# Patient Record
Sex: Female | Born: 1985 | Race: White | Hispanic: No | Marital: Married | Smoking: Former smoker
Health system: Southern US, Community
[De-identification: ages and names within clinical notes are randomized; demographics above are authoritative.]

## PROBLEM LIST (undated history)

## (undated) ENCOUNTER — Inpatient Hospital Stay (HOSPITAL_COMMUNITY): Payer: Self-pay

## (undated) DIAGNOSIS — Z87442 Personal history of urinary calculi: Secondary | ICD-10-CM

## (undated) DIAGNOSIS — F32A Depression, unspecified: Secondary | ICD-10-CM

## (undated) DIAGNOSIS — F419 Anxiety disorder, unspecified: Secondary | ICD-10-CM

## (undated) DIAGNOSIS — R198 Other specified symptoms and signs involving the digestive system and abdomen: Secondary | ICD-10-CM

## (undated) DIAGNOSIS — F329 Major depressive disorder, single episode, unspecified: Secondary | ICD-10-CM

## (undated) DIAGNOSIS — M67919 Unspecified disorder of synovium and tendon, unspecified shoulder: Secondary | ICD-10-CM

## (undated) DIAGNOSIS — M19011 Primary osteoarthritis, right shoulder: Secondary | ICD-10-CM

## (undated) DIAGNOSIS — J069 Acute upper respiratory infection, unspecified: Secondary | ICD-10-CM

## (undated) DIAGNOSIS — M719 Bursopathy, unspecified: Secondary | ICD-10-CM

## (undated) DIAGNOSIS — M24111 Other articular cartilage disorders, right shoulder: Secondary | ICD-10-CM

## (undated) HISTORY — DX: Anxiety disorder, unspecified: F41.9

## (undated) HISTORY — DX: Major depressive disorder, single episode, unspecified: F32.9

## (undated) HISTORY — PX: WISDOM TOOTH EXTRACTION: SHX21

## (undated) HISTORY — DX: Depression, unspecified: F32.A

## (undated) HISTORY — PX: TONSILLECTOMY: SUR1361

---

## 2005-02-17 ENCOUNTER — Emergency Department (HOSPITAL_COMMUNITY): Admission: EM | Admit: 2005-02-17 | Discharge: 2005-02-18 | Payer: Self-pay | Admitting: Emergency Medicine

## 2005-03-18 ENCOUNTER — Other Ambulatory Visit: Admission: RE | Admit: 2005-03-18 | Discharge: 2005-03-18 | Payer: Self-pay | Admitting: Obstetrics and Gynecology

## 2006-08-01 ENCOUNTER — Emergency Department (HOSPITAL_COMMUNITY): Admission: EM | Admit: 2006-08-01 | Discharge: 2006-08-01 | Payer: Self-pay | Admitting: Emergency Medicine

## 2008-04-13 ENCOUNTER — Inpatient Hospital Stay (HOSPITAL_COMMUNITY): Admission: AD | Admit: 2008-04-13 | Discharge: 2008-04-13 | Payer: Self-pay | Admitting: Obstetrics and Gynecology

## 2009-07-29 ENCOUNTER — Inpatient Hospital Stay (HOSPITAL_COMMUNITY): Admission: AD | Admit: 2009-07-29 | Discharge: 2009-07-29 | Payer: Self-pay | Admitting: Obstetrics & Gynecology

## 2009-09-01 ENCOUNTER — Other Ambulatory Visit: Admission: RE | Admit: 2009-09-01 | Discharge: 2009-09-01 | Payer: Self-pay | Admitting: Obstetrics and Gynecology

## 2009-10-21 ENCOUNTER — Inpatient Hospital Stay (HOSPITAL_COMMUNITY): Admission: AD | Admit: 2009-10-21 | Discharge: 2009-10-21 | Payer: Self-pay | Admitting: Obstetrics and Gynecology

## 2010-03-24 ENCOUNTER — Inpatient Hospital Stay (HOSPITAL_COMMUNITY)
Admission: RE | Admit: 2010-03-24 | Discharge: 2010-03-27 | Payer: Self-pay | Source: Home / Self Care | Attending: Obstetrics and Gynecology | Admitting: Obstetrics and Gynecology

## 2010-05-09 ENCOUNTER — Encounter: Payer: Self-pay | Admitting: Emergency Medicine

## 2010-06-29 LAB — COMPREHENSIVE METABOLIC PANEL
ALT: 16 U/L (ref 0–35)
AST: 23 U/L (ref 0–37)
Albumin: 2.8 g/dL — ABNORMAL LOW (ref 3.5–5.2)
Alkaline Phosphatase: 135 U/L — ABNORMAL HIGH (ref 39–117)
BUN: 9 mg/dL (ref 6–23)
CO2: 19 mEq/L (ref 19–32)
Calcium: 9.4 mg/dL (ref 8.4–10.5)
Chloride: 106 mEq/L (ref 96–112)
Creatinine, Ser: 0.61 mg/dL (ref 0.4–1.2)
GFR calc Af Amer: >60 mL/min (ref >60)
GFR calc non Af Amer: >60 mL/min (ref >60)
Glucose, Bld: 108 mg/dL — ABNORMAL HIGH (ref 70–99)
Potassium: 3.9 mEq/L (ref 3.5–5.1)
Sodium: 135 mEq/L (ref 135–145)
Total Bilirubin: 0.2 mg/dL — ABNORMAL LOW (ref 0.3–1.2)
Total Protein: 5.9 g/dL — ABNORMAL LOW (ref 6.0–8.3)

## 2010-06-29 LAB — CBC
HCT: 31.8 % — ABNORMAL LOW (ref 36.0–46.0)
HCT: 36.1 % (ref 36.0–46.0)
HCT: 36.6 % (ref 36.0–46.0)
Hemoglobin: 10.7 g/dL — ABNORMAL LOW (ref 12.0–15.0)
Hemoglobin: 12.1 g/dL (ref 12.0–15.0)
Hemoglobin: 12.6 g/dL (ref 12.0–15.0)
MCH: 31.4 pg (ref 26.0–34.0)
MCH: 32.3 pg (ref 26.0–34.0)
MCH: 32.3 pg (ref 26.0–34.0)
MCHC: 33.5 g/dL (ref 30.0–36.0)
MCHC: 33.6 g/dL (ref 30.0–36.0)
MCHC: 34.3 g/dL (ref 30.0–36.0)
MCV: 94 fL (ref 78.0–100.0)
MCV: 94.2 fL (ref 78.0–100.0)
MCV: 95.9 fL (ref 78.0–100.0)
Platelets: 219 10*3/uL (ref 150–400)
Platelets: 242 10*3/uL (ref 150–400)
Platelets: 245 10*3/uL (ref 150–400)
RBC: 3.31 MIL/uL — ABNORMAL LOW (ref 3.87–5.11)
RBC: 3.84 MIL/uL — ABNORMAL LOW (ref 3.87–5.11)
RBC: 3.89 MIL/uL (ref 3.87–5.11)
RDW: 13.8 % (ref 11.5–15.5)
RDW: 14.4 % (ref 11.5–15.5)
RDW: 14.6 % (ref 11.5–15.5)
WBC: 11.2 10*3/uL — ABNORMAL HIGH (ref 4.0–10.5)
WBC: 13.2 10*3/uL — ABNORMAL HIGH (ref 4.0–10.5)
WBC: 19.7 10*3/uL — ABNORMAL HIGH (ref 4.0–10.5)

## 2010-06-29 LAB — URIC ACID: Uric Acid, Serum: 6 mg/dL (ref 2.4–7.0)

## 2010-06-29 LAB — LACTATE DEHYDROGENASE: LDH: 134 U/L (ref 94–250)

## 2010-06-29 LAB — RPR: RPR Ser Ql: NONREACTIVE

## 2010-07-07 LAB — GC/CHLAMYDIA PROBE AMP, GENITAL
Chlamydia, DNA Probe: NEGATIVE
GC Probe Amp, Genital: NEGATIVE

## 2010-07-07 LAB — CBC
HCT: 37.4 % (ref 36.0–46.0)
Hemoglobin: 12.6 g/dL (ref 12.0–15.0)
MCHC: 33.6 g/dL (ref 30.0–36.0)
MCV: 91.1 fL (ref 78.0–100.0)
Platelets: 315 10*3/uL (ref 150–400)
RBC: 4.11 MIL/uL (ref 3.87–5.11)
RDW: 12.6 % (ref 11.5–15.5)
WBC: 14.4 10*3/uL — ABNORMAL HIGH (ref 4.0–10.5)

## 2010-07-07 LAB — URINALYSIS, ROUTINE W REFLEX MICROSCOPIC
Bilirubin Urine: NEGATIVE
Glucose, UA: NEGATIVE mg/dL
Hgb urine dipstick: NEGATIVE
Ketones, ur: NEGATIVE mg/dL
Nitrite: NEGATIVE
Protein, ur: NEGATIVE mg/dL
Specific Gravity, Urine: 1.02 (ref 1.005–1.030)
Urobilinogen, UA: 0.2 mg/dL (ref 0.0–1.0)
pH: 6.5 (ref 5.0–8.0)

## 2010-07-07 LAB — DIFFERENTIAL
Basophils Absolute: 0 10*3/uL (ref 0.0–0.1)
Basophils Relative: 0 % (ref 0–1)
Eosinophils Absolute: 0.1 10*3/uL (ref 0.0–0.7)
Eosinophils Relative: 1 % (ref 0–5)
Lymphocytes Relative: 20 % (ref 12–46)
Lymphs Abs: 2.9 10*3/uL (ref 0.7–4.0)
Monocytes Absolute: 1 10*3/uL (ref 0.1–1.0)
Monocytes Relative: 7 % (ref 3–12)
Neutro Abs: 10.3 10*3/uL — ABNORMAL HIGH (ref 1.7–7.7)
Neutrophils Relative %: 72 % (ref 43–77)

## 2010-07-07 LAB — HCG, QUANTITATIVE, PREGNANCY: hCG, Beta Chain, Quant, S: 110183 m[IU]/mL — ABNORMAL HIGH (ref <5)

## 2010-07-07 LAB — POCT PREGNANCY, URINE: Preg Test, Ur: POSITIVE

## 2010-07-07 LAB — WET PREP, GENITAL
Clue Cells Wet Prep HPF POC: NONE SEEN
Trich, Wet Prep: NONE SEEN
Yeast Wet Prep HPF POC: NONE SEEN

## 2010-07-07 LAB — ABO/RH: ABO/RH(D): A POS

## 2010-08-17 ENCOUNTER — Other Ambulatory Visit (HOSPITAL_COMMUNITY)
Admission: RE | Admit: 2010-08-17 | Discharge: 2010-08-17 | Disposition: A | Discharge status: Home / Self Care | Payer: 59 | Source: Ambulatory Visit | Attending: Obstetrics and Gynecology | Admitting: Obstetrics and Gynecology

## 2010-08-17 ENCOUNTER — Other Ambulatory Visit: Payer: Self-pay | Admitting: Obstetrics and Gynecology

## 2010-08-17 DIAGNOSIS — Z01419 Encounter for gynecological examination (general) (routine) without abnormal findings: Secondary | ICD-10-CM | POA: Insufficient documentation

## 2011-01-21 LAB — URINALYSIS, ROUTINE W REFLEX MICROSCOPIC
Bilirubin Urine: NEGATIVE
Glucose, UA: NEGATIVE mg/dL
Ketones, ur: NEGATIVE mg/dL
Leukocytes, UA: NEGATIVE
Nitrite: NEGATIVE
Protein, ur: NEGATIVE mg/dL
Specific Gravity, Urine: <1.005 — ABNORMAL LOW (ref 1.005–1.030)
Urobilinogen, UA: 0.2 mg/dL (ref 0.0–1.0)
pH: 7 (ref 5.0–8.0)

## 2011-01-21 LAB — URINE MICROSCOPIC-ADD ON

## 2011-01-21 LAB — URINE CULTURE
Colony Count: NO GROWTH
Culture: NO GROWTH

## 2011-02-26 ENCOUNTER — Inpatient Hospital Stay (HOSPITAL_COMMUNITY)
Admission: AD | Admit: 2011-02-26 | Discharge: 2011-02-26 | Disposition: A | Discharge status: Home / Self Care | Payer: 59 | Source: Ambulatory Visit | Attending: Obstetrics and Gynecology | Admitting: Obstetrics and Gynecology

## 2011-02-26 ENCOUNTER — Encounter (HOSPITAL_COMMUNITY): Payer: Self-pay | Admitting: *Deleted

## 2011-02-26 ENCOUNTER — Inpatient Hospital Stay (HOSPITAL_COMMUNITY): Payer: 59

## 2011-02-26 DIAGNOSIS — O209 Hemorrhage in early pregnancy, unspecified: Secondary | ICD-10-CM

## 2011-02-26 DIAGNOSIS — O26859 Spotting complicating pregnancy, unspecified trimester: Secondary | ICD-10-CM | POA: Insufficient documentation

## 2011-02-26 DIAGNOSIS — R109 Unspecified abdominal pain: Secondary | ICD-10-CM | POA: Insufficient documentation

## 2011-02-26 LAB — WET PREP, GENITAL: Yeast Wet Prep HPF POC: NONE SEEN

## 2011-02-26 LAB — URINALYSIS, ROUTINE W REFLEX MICROSCOPIC
Bilirubin Urine: NEGATIVE
Glucose, UA: NEGATIVE mg/dL
Specific Gravity, Urine: 1.015 (ref 1.005–1.030)
Urobilinogen, UA: 0.2 mg/dL (ref 0.0–1.0)

## 2011-02-26 LAB — URINE MICROSCOPIC-ADD ON

## 2011-02-26 LAB — HCG, QUANTITATIVE, PREGNANCY: hCG, Beta Chain, Quant, S: 17325 m[IU]/mL — ABNORMAL HIGH (ref <5)

## 2011-02-26 LAB — POCT PREGNANCY, URINE: Preg Test, Ur: POSITIVE

## 2011-02-26 NOTE — ED Provider Notes (Signed)
Chief Complaint:  Vaginal Bleeding and Abdominal Cramping   Shannon [name] is  25 y.o. G3P1011 at [redacted]w[redacted]d presents complaining of Vaginal Bleeding and Abdominal Cramping Denies recent intercourse.  Had + HPT 3 days ago, periods are irregular so unsure of LMP.  Had brown spotting  Obstetrical/Gynecological History: Menstrual History: OB History    Grav Para Term Preterm Abortions TAB SAB Ect Mult Living   3 1 1  1  1   1        Patient's last menstrual period was 12/11/2010.     Past Medical History: Past Medical History  Diagnosis Date  . PIH (pregnancy induced hypertension)     Past Surgical History: Past Surgical History  Procedure Date  . Tonsillectomy   . Wisdom tooth extraction     Family History: No family history on file.  Social History: History  Substance Use Topics  . Smoking status: Never Smoker   . Smokeless tobacco: Not on file  . Alcohol Use: No    Allergies:  Allergies  Allergen Reactions  . Penicillins Hives    Meds:  Prescriptions prior to admission  Medication Sig Dispense Refill  . ibuprofen (ADVIL,MOTRIN) 200 MG tablet Take 200 mg by mouth every 6 (six) hours as needed. For pain         Review of Systems - Please refer to the aforementioned patients' reports.     Physical Exam  Blood pressure 130/85, pulse 115, temperature 98 F (36.7 C), resp. rate 16, height 5\' 10"  (1.778 m), weight 77.565 kg (171 lb), last menstrual period 12/11/2010. GENERAL: Well-developed, well-nourished female in no acute distress.  LUNGS: Clear to auscultation bilaterally.  HEART: Regular rate and rhythm. ABDOMEN: Soft, nontender, nondistended, gravid.  EXTREMITIES: Nontender, no edema, 2+ distal pulses. Cervical Exam: Dilatation 0cm   Effacement 0%    SSE:  Normal appearing discharge, cx non friable.  Wet prep, gc/chl collected   Labs: Recent Results (from the past 24 hour(s))  URINALYSIS, ROUTINE W REFLEX MICROSCOPIC   Collection Time   02/26/11  3:30  PM      Component Value Range   Color, Urine YELLOW  YELLOW    Appearance CLEAR  CLEAR    Specific Gravity, Urine 1.015  1.005 - 1.030    pH 6.0  5.0 - 8.0    Glucose, UA NEGATIVE  NEGATIVE (mg/dL)   Hgb urine dipstick TRACE (*) NEGATIVE    Bilirubin Urine NEGATIVE  NEGATIVE    Ketones, ur NEGATIVE  NEGATIVE (mg/dL)   Protein, ur NEGATIVE  NEGATIVE (mg/dL)   Urobilinogen, UA 0.2  0.0 - 1.0 (mg/dL)   Nitrite NEGATIVE  NEGATIVE    Leukocytes, UA NEGATIVE  NEGATIVE   URINE MICROSCOPIC-ADD ON   Collection Time   02/26/11  3:30 PM      Component Value Range   Squamous Epithelial / LPF FEW (*) RARE    WBC, UA 0-2  <3 (WBC/hpf)   RBC / HPF 0-2  <3 (RBC/hpf)   Bacteria, UA FEW (*) RARE   POCT PREGNANCY, URINE   Collection Time   02/26/11  3:37 PM      Component Value Range   Preg Test, Ur POSITIVE    Blood type A+ Imaging Studies:  US Ob Comp Less 14 Wks  02/26/2011  *RADIOLOGY REPORT*  Clinical Data: Abdominal pain, spotting  OBSTETRIC <14 WK ULTRASOUND  Technique:  Transabdominal ultrasound was performed for evaluation of the gestation as well as the maternal uterus and adnexal  regions.  Comparison:  None.  Intrauterine gestational sac: Present Yolk sac: Present Embryo: Present Cardiac Activity: Present Heart Rate: 110 bpm  CRL:  2 mm  fivew   sixd  Maternal uterus/Adnexae: No subchorionic hemorrhage.  Ovaries are normal.  No free fluid.  IMPRESSION: Single uterine gestational sac with embryo and cardiac activity.  Estimated gestational age by crown-rump length equals 5 weeks 6 days.  Original Report Authenticated By: Genevive Bi, M.D.     Assessment: Shannon [name] is  25 y.o. G3P1011 at [redacted]w[redacted]d presents with early IUP, spotting Dr. Chilton Si given report  Plan: F/U with MD prn any more problems  Shannon [name] Shannon [name] 11/10/20124:13 PM

## 2011-02-26 NOTE — Progress Notes (Signed)
Positive home pregnancy test 3 days ago, spotting and cramping today, unsure of last period ?August or September.

## 2011-03-14 LAB — OB RESULTS CONSOLE RUBELLA ANTIBODY, IGM: Rubella: IMMUNE

## 2011-03-14 LAB — OB RESULTS CONSOLE RPR: RPR: NONREACTIVE

## 2011-04-19 NOTE — L&D Delivery Note (Signed)
Delivery Note At 10:01 PM a viable female was delivered via Vaginal, Spontaneous Delivery (Presentation: Middle Occiput Anterior).  APGAR: 8, 9; weight .   Placenta status: Intact, Spontaneous.  Cord: 3 vessels with the following complications: None.  Cord pH: NA  Anesthesia: Epidural  Episiotomy: None Lacerations: 1st degree;Periurethral Suture Repair: 3.0 vicryl Est. Blood Loss (mL): 300  Mom to postpartum.  Baby to nursery-stable.  Shannon Lubas J. 10/18/2011, 10:30 PM

## 2011-07-10 ENCOUNTER — Inpatient Hospital Stay (HOSPITAL_COMMUNITY)
Admission: AD | Admit: 2011-07-10 | Discharge: 2011-07-10 | Disposition: A | Discharge status: Home / Self Care | Payer: 59 | Source: Ambulatory Visit | Attending: Obstetrics and Gynecology | Admitting: Obstetrics and Gynecology

## 2011-07-10 ENCOUNTER — Encounter (HOSPITAL_COMMUNITY): Payer: Self-pay

## 2011-07-10 DIAGNOSIS — R197 Diarrhea, unspecified: Secondary | ICD-10-CM

## 2011-07-10 DIAGNOSIS — R42 Dizziness and giddiness: Secondary | ICD-10-CM | POA: Insufficient documentation

## 2011-07-10 DIAGNOSIS — Z331 Pregnant state, incidental: Secondary | ICD-10-CM

## 2011-07-10 DIAGNOSIS — R51 Headache: Secondary | ICD-10-CM | POA: Insufficient documentation

## 2011-07-10 DIAGNOSIS — R748 Abnormal levels of other serum enzymes: Secondary | ICD-10-CM | POA: Insufficient documentation

## 2011-07-10 DIAGNOSIS — O99891 Other specified diseases and conditions complicating pregnancy: Secondary | ICD-10-CM | POA: Insufficient documentation

## 2011-07-10 LAB — URINALYSIS, ROUTINE W REFLEX MICROSCOPIC
Glucose, UA: NEGATIVE mg/dL
Leukocytes, UA: NEGATIVE
Nitrite: NEGATIVE
Specific Gravity, Urine: <1.005 — ABNORMAL LOW (ref 1.005–1.030)
pH: 7 (ref 5.0–8.0)

## 2011-07-10 LAB — DIFFERENTIAL
Basophils Absolute: 0 10*3/uL (ref 0.0–0.1)
Eosinophils Absolute: 0.2 10*3/uL (ref 0.0–0.7)
Eosinophils Relative: 2 % (ref 0–5)
Lymphocytes Relative: 23 % (ref 12–46)
Lymphs Abs: 2.9 10*3/uL (ref 0.7–4.0)
Monocytes Absolute: 1 10*3/uL (ref 0.1–1.0)

## 2011-07-10 LAB — COMPREHENSIVE METABOLIC PANEL
ALT: 69 U/L — ABNORMAL HIGH (ref 0–35)
Alkaline Phosphatase: 85 U/L (ref 39–117)
BUN: 7 mg/dL (ref 6–23)
Chloride: 102 mEq/L (ref 96–112)
GFR calc Af Amer: >90 mL/min (ref >90)
Glucose, Bld: 93 mg/dL (ref 70–99)
Potassium: 3.2 mEq/L — ABNORMAL LOW (ref 3.5–5.1)
Sodium: 136 mEq/L (ref 135–145)
Total Bilirubin: 0.1 mg/dL — ABNORMAL LOW (ref 0.3–1.2)
Total Protein: 6.1 g/dL (ref 6.0–8.3)

## 2011-07-10 LAB — CBC
HCT: 34.6 % — ABNORMAL LOW (ref 36.0–46.0)
Hemoglobin: 11.5 g/dL — ABNORMAL LOW (ref 12.0–15.0)
MCHC: 33.2 g/dL (ref 30.0–36.0)
RBC: 3.82 MIL/uL — ABNORMAL LOW (ref 3.87–5.11)
WBC: 12.8 10*3/uL — ABNORMAL HIGH (ref 4.0–10.5)

## 2011-07-10 LAB — GLUCOSE, CAPILLARY: Glucose-Capillary: 107 mg/dL — ABNORMAL HIGH (ref 70–99)

## 2011-07-10 NOTE — Discharge Instructions (Signed)
Bowel Movement Culture  A bowel movement culture checks your poop (bowel movement) for illnesses. The doctor will give you all the supplies you need. For each sample you collect, you may get:  A small container. You may be given different colored containers. Follow the instructions for each container you are given.  Gloves that can be thrown away.  A plastic bag.  Ask the doctor if you have questions. BEFORE COLLECTING THE SAMPLE Cover the toilet bowl with plastic wrap or a plastic bag. Tape the wrap or bag to the bowl of the toilet (not the seat). Do not stretch the plastic tight across the bowl. Leave room for the poop to fall. You can also use a plastic carton to catch your poop. Wash and dry any cartons. Keep these in the bathroom.  Try not to mix pee (urine) with your poop. Pee before pooping.  Do not mix toilet paper or water with your sample.  Women on their period should wait 3 days after their period has ended before collecting a sample.  COLLECTING THE SAMPLE Wash your hands.  Put on gloves.  Do not pour out the fluid that is in the tube. This fluid will preserve your sample.  Use the small shovel built into the top of the tube to put small scoops of your poop into the tube. Choose the parts of your poop which are bloody, slimy, or watery. Fill the tube up to the red line on the tube label. If your poop is hard, choose samples from each end and the middle.  Stir the poop in the tube with the small shovel. Close the top on the tube tightly. Shake the tube until the poop is well mixed.  On the label, write:  The date and time you collected the sample.  Your initials.  Put the tube in the plastic bag that was given to you.  If your doctor wants you to collect more than 1 sample, collect them at different times you poop.  Follow these instructions each time you collect a sample. AFTER COLLECTING THE SAMPLE Store your sample(s) using the directions on each test container you were given.  Some samples should be kept at room temperature. Others need to be refrigerated right away.  You may need to return the sample within 24 hours. Check the directions or call the clinic if you are not sure.  Flush the rest of your poop down the toilet (but not the plastic wrap). Throw away the gloves. Throw away the carton, if you used one.  Wash your hands.  Document Released: 05/07/2010 Document Revised: 03/24/2011 Document Reviewed: 05/07/2010 [facility] Patient Information 2012 [place], [place].B.R.A.T. Diet Your doctor has recommended the B.R.A.T. diet for you or your child until the condition improves. This is often used to help control diarrhea and vomiting symptoms. If you or your child can tolerate clear liquids, you may have:  Bananas.   Rice.   Applesauce.   Toast (and other simple starches such as crackers, potatoes, noodles).  Be sure to avoid dairy products, meats, and fatty foods until symptoms are better. Fruit juices such as apple, grape, and prune juice can make diarrhea worse. Avoid these. Continue this diet for 2 days or as instructed by your caregiver. Document Released: 04/04/2005 Document Revised: 03/24/2011 Document Reviewed: 09/21/2006 [facility] Patient Information 2012 [place], [place].Diarrhea Infections caused by germs (bacterial) or a virus commonly cause diarrhea. Your caregiver has determined that with time, rest and fluids, the diarrhea should improve. In  general, eat normally while drinking more water than usual. Although water may prevent dehydration, it does not contain salt and minerals (electrolytes). Broths, weak tea without caffeine and oral rehydration solutions (ORS) replace fluids and electrolytes. Small amounts of fluids should be taken frequently. Large amounts at one time may not be tolerated. Plain water may be harmful in infants and the elderly. Oral rehydrating solutions (ORS) are available at pharmacies and grocery stores. ORS replace water and important  electrolytes in proper proportions. Sports drinks are not as effective as ORS and may be harmful due to sugars worsening diarrhea.  ORS is especially recommended for use in children with diarrhea. As a general guideline for children, replace any new fluid losses from diarrhea and/or vomiting with ORS as follows:   If your child weighs 22 pounds or under (10 kg or less), give 60-120 mL ( -  cup or 2 - 4 ounces) of ORS for each episode of diarrheal stool or vomiting episode.   If your child weighs more than 22 pounds (more than 10 kgs), give 120-240 mL ( - 1 cup or 4 - 8 ounces) of ORS for each diarrheal stool or episode of vomiting.   While correcting for dehydration, children should eat normally. However, foods high in sugar should be avoided because this may worsen diarrhea. Large amounts of carbonated soft drinks, juice, gelatin desserts and other highly sugared drinks should be avoided.   After correction of dehydration, other liquids that are appealing to the child may be added. Children should drink small amounts of fluids frequently and fluids should be increased as tolerated. Children should drink enough fluids to keep urine clear or pale yellow.   Adults should eat normally while drinking more fluids than usual. Drink small amounts of fluids frequently and increase as tolerated. Drink enough fluids to keep urine clear or pale yellow. Broths, weak decaffeinated tea, lemon lime soft drinks (allowed to go flat) and ORS replace fluids and electrolytes.   Avoid:   Carbonated drinks.   Juice.   Extremely hot or cold fluids.   Caffeine drinks.   Fatty, greasy foods.   Alcohol.   Tobacco.   Too much intake of anything at one time.   Gelatin desserts.   Probiotics are active cultures of beneficial bacteria. They may lessen the amount and number of diarrheal stools in adults. Probiotics can be found in yogurt with active cultures and in supplements.   Wash hands well to avoid  spreading bacteria and virus.   Anti-diarrheal medications are not recommended for infants and children.   Only take over-the-counter or prescription medicines for pain, discomfort or fever as directed by your caregiver. Do not give aspirin to children because it may cause Reye's Syndrome.   For adults, ask your caregiver if you should continue all prescribed and over-the-counter medicines.   If your caregiver has given you a follow-up appointment, it is very important to keep that appointment. Not keeping the appointment could result in a chronic or permanent injury, and disability. If there is any problem keeping the appointment, you must call back to this facility for assistance.  SEEK IMMEDIATE MEDICAL CARE IF:   You or your child is unable to keep fluids down or other symptoms or problems become worse in spite of treatment.   Vomiting or diarrhea develops and becomes persistent.   There is vomiting of blood or bile (green material).   There is blood in the stool or the stools are black and tarry.  There is no urine output in 6-8 hours or there is only a small amount of very dark urine.   Abdominal pain develops, increases or localizes.   You have a fever.   Your baby is older than 3 months with a rectal temperature of 102 F (38.9 C) or higher.   Your baby is 47 months old or younger with a rectal temperature of 100.4 F (38 C) or higher.   You or your child develops excessive weakness, dizziness, fainting or extreme thirst.   You or your child develops a rash, stiff neck, severe headache or become irritable or sleepy and difficult to awaken.  MAKE SURE YOU:   Understand these instructions.   Will watch your condition.   Will get help right away if you are not doing well or get worse.  Document Released: 03/25/2002 Document Revised: 03/24/2011 Document Reviewed: 02/09/2009 [facility] Patient Information 2012 [place], [place].

## 2011-07-10 NOTE — MAU Note (Signed)
Lightheaded since last night, headache, denies fever  30 weeks

## 2011-07-10 NOTE — MAU Provider Note (Signed)
D/w provider.  Labs ordered for tomorrow at [facility] ob/gyn.

## 2011-07-10 NOTE — MAU Note (Signed)
Patient c/o diarrhea since Friday,, took Imodium

## 2011-07-10 NOTE — MAU Provider Note (Signed)
Chief Complaint:  Dizziness, Headache and Diarrhea    First Provider Initiated Contact with Patient 07/10/11 Shannon [name] is  26 y.o. G3P1011.  Patient's last menstrual period was 12/11/2010.. [redacted]w[redacted]d   She presents complaining of Dizziness, Headache and Diarrhea . Onset is described as ongoing and has been present for  3 days. Extremely foul-smelling diarrhea since Friday, states it has slowed today after taking Imodium this morning. Denies nausea, vomiting, fever, chills and abd pain. Reports dizziness and light headed today. Worse with movement but still present when lying down.   Obstetrical/Gynecological History: OB History    Grav Para Term Preterm Abortions TAB SAB Ect Mult Living   3 1 1  1  0 1 0 0 1      Past Medical History: Past Medical History  Diagnosis Date  . PIH (pregnancy induced hypertension)   . Pregnancy induced hypertension     Past Surgical History: Past Surgical History  Procedure Date  . Tonsillectomy   . Wisdom tooth extraction     Family History: Family History  Problem Relation Age of Onset  . Anesthesia problems Neg Hx     Social History: History  Substance Use Topics  . Smoking status: Never Smoker   . Smokeless tobacco: Not on file  . Alcohol Use: No    Allergies:  Allergies  Allergen Reactions  . Penicillins Hives    Prescriptions prior to admission  Medication Sig Dispense Refill  . acetaminophen (TYLENOL) 500 MG tablet Take 1,000 mg by mouth every 6 (six) hours as needed. Head ache      . loperamide (IMODIUM A-D) 2 MG tablet Take 4 mg by mouth 4 (four) times daily as needed. Upset stomach      . Prenatal Vit-Fe Fumarate-FA (PRENATAL MULTIVITAMIN) TABS Take 1 tablet by mouth at bedtime.        Review of Systems - Negative except what has been reviewed in the HPI  Physical Exam   Blood pressure 124/72, pulse 93, temperature 98 F (36.7 C), temperature source Oral, resp. rate 16, height 5\' 10"  (1.778 m), weight  177 lb 3.2 oz (80.377 kg), last menstrual period 12/11/2010, SpO2 100.00%.  General: General appearance - alert, well appearing, and in no distress, oriented to person, place, and time and overweight Mental status - alert, oriented to person, place, and time, normal mood, behavior, speech, dress, motor activity, and thought processes, affect appropriate to mood Abdomen - gravid, non tender Focused Gynecological Exam: examination not indicated,  FHT: Toco  Labs: Recent Results (from the past 24 hour(s))  URINALYSIS, ROUTINE W REFLEX MICROSCOPIC   Collection Time   07/10/11  6:02 PM      Component Value Range   Color, Urine YELLOW  YELLOW    APPearance CLEAR  CLEAR    Specific Gravity, Urine <1.005 (*) 1.005 - 1.030    pH 7.0  5.0 - 8.0    Glucose, UA NEGATIVE  NEGATIVE (mg/dL)   Hgb urine dipstick NEGATIVE  NEGATIVE    Bilirubin Urine NEGATIVE  NEGATIVE    Ketones, ur NEGATIVE  NEGATIVE (mg/dL)   Protein, ur NEGATIVE  NEGATIVE (mg/dL)   Urobilinogen, UA 0.2  0.0 - 1.0 (mg/dL)   Nitrite NEGATIVE  NEGATIVE    Leukocytes, UA NEGATIVE  NEGATIVE   GLUCOSE, CAPILLARY   Collection Time   07/10/11  6:28 PM      Component Value Range   Glucose-Capillary 107 (*) 70 - 99 (mg/dL)  CBC   Collection Time   07/10/11  6:35 PM      Component Value Range   WBC 12.8 (*) 4.0 - 10.5 (K/uL)   RBC 3.82 (*) 3.87 - 5.11 (MIL/uL)   Hemoglobin 11.5 (*) 12.0 - 15.0 (g/dL)   HCT 16.1 (*) 09.6 - 46.0 (%)   MCV 90.6  78.0 - 100.0 (fL)   MCH 30.1  26.0 - 34.0 (pg)   MCHC 33.2  30.0 - 36.0 (g/dL)   RDW 04.5  40.9 - 81.1 (%)   Platelets 335  150 - 400 (K/uL)  COMPREHENSIVE METABOLIC PANEL   Collection Time   07/10/11  6:35 PM      Component Value Range   Sodium 136  135 - 145 (mEq/L)   Potassium 3.2 (*) 3.5 - 5.1 (mEq/L)   Chloride 102  96 - 112 (mEq/L)   CO2 23  19 - 32 (mEq/L)   Glucose, Bld 93  70 - 99 (mg/dL)   BUN 7  6 - 23 (mg/dL)   Creatinine, Ser 9.14  0.50 - 1.10 (mg/dL)   Calcium 9.3  8.4  - 10.5 (mg/dL)   Total Protein 6.1  6.0 - 8.3 (g/dL)   Albumin 2.8 (*) 3.5 - 5.2 (g/dL)   AST 43 (*) 0 - 37 (U/L)   ALT 69 (*) 0 - 35 (U/L)   Alkaline Phosphatase 85  39 - 117 (U/L)   Total Bilirubin 0.1 (*) 0.3 - 1.2 (mg/dL)   GFR calc non Af Amer >90  >90 (mL/min)   GFR calc Af Amer >90  >90 (mL/min)  DIFFERENTIAL   Collection Time   07/10/11  6:35 PM      Component Value Range   Neutrophils Relative 68  43 - 77 (%)   Neutro Abs 8.7 (*) 1.7 - 7.7 (K/uL)   Lymphocytes Relative 23  12 - 46 (%)   Lymphs Abs 2.9  0.7 - 4.0 (K/uL)   Monocytes Relative 8  3 - 12 (%)   Monocytes Absolute 1.0  0.1 - 1.0 (K/uL)   Eosinophils Relative 2  0 - 5 (%)   Eosinophils Absolute 0.2  0.0 - 0.7 (K/uL)   Basophils Relative 0  0 - 1 (%)   Basophils Absolute 0.0  0.0 - 0.1 (K/uL)   MD Consult: Discussed with Dr. Dion Body.  7:58 PM Dr. Dion Body updated with lab results, will obtain acute hepatitis panel and send pt home with stool culture kit. Pt to FU in office tomorrow for repeat labs. Stop imodium   Assessment: Pregnant [redacted]w[redacted]d Diarrhea Elevated Liver Enzyme of unknown etiology  Plan: Discharge home Stool culture FU in office tomorrow to return culture and repeat labs Pt instructed to stop immodium  Saanya Zieske E. 07/10/2011,7:58 PM

## 2011-07-11 LAB — HEPATITIS PANEL, ACUTE: Hep B C IgM: NEGATIVE

## 2011-08-15 ENCOUNTER — Inpatient Hospital Stay (HOSPITAL_COMMUNITY)
Admission: AD | Admit: 2011-08-15 | Discharge: 2011-08-15 | Disposition: A | Discharge status: Home / Self Care | Payer: 59 | Source: Ambulatory Visit | Attending: Obstetrics and Gynecology | Admitting: Obstetrics and Gynecology

## 2011-08-15 ENCOUNTER — Encounter (HOSPITAL_COMMUNITY): Payer: Self-pay | Admitting: *Deleted

## 2011-08-15 DIAGNOSIS — O99891 Other specified diseases and conditions complicating pregnancy: Secondary | ICD-10-CM | POA: Insufficient documentation

## 2011-08-15 DIAGNOSIS — O26899 Other specified pregnancy related conditions, unspecified trimester: Secondary | ICD-10-CM

## 2011-08-15 DIAGNOSIS — R109 Unspecified abdominal pain: Secondary | ICD-10-CM | POA: Insufficient documentation

## 2011-08-15 LAB — URINALYSIS, ROUTINE W REFLEX MICROSCOPIC
Glucose, UA: NEGATIVE mg/dL
Leukocytes, UA: NEGATIVE
Nitrite: NEGATIVE
Specific Gravity, Urine: 1.015 (ref 1.005–1.030)
pH: 7.5 (ref 5.0–8.0)

## 2011-08-15 LAB — URINE MICROSCOPIC-ADD ON

## 2011-08-15 NOTE — MAU Note (Signed)
Pt states she started having abdominal cramping abour 1300

## 2011-08-15 NOTE — MAU Note (Signed)
Patient states she has been having regular cramping since about 1300. Denies any bleeding or leaking. Reports fetal movement, not as much as usual.

## 2011-08-15 NOTE — MAU Provider Note (Signed)
History     CSN: [id number]  Arrival date and time: 08/15/11 1436   First Provider Initiated Contact with Patient 08/15/11 1608      Chief Complaint  Patient presents with  . Labor Eval   HPI 26 y.o. G3P1011 at [redacted]w[redacted]d with cramping x 2 hours, now resolved, no bleeding or LOF, + nausea, + fetal movement. Sees Dr. Richardson Dopp for prenatal care - next appointment tomorrow.    Past Medical History  Diagnosis Date  . PIH (pregnancy induced hypertension)   . Pregnancy induced hypertension     Past Surgical History  Procedure Date  . Tonsillectomy   . Wisdom tooth extraction     Family History  Problem Relation Age of Onset  . Anesthesia problems Neg Hx     History  Substance Use Topics  . Smoking status: Former Games developer  . Smokeless tobacco: Former Neurosurgeon    Quit date: 08/14/2009  . Alcohol Use: No    Allergies:  Allergies  Allergen Reactions  . Penicillins Hives    Prescriptions prior to admission  Medication Sig Dispense Refill  . ferrous sulfate 325 (65 FE) MG tablet Take 325 mg by mouth daily with breakfast.      . Prenatal Vit-Fe Fumarate-FA (PRENATAL MULTIVITAMIN) TABS Take 1 tablet by mouth at bedtime.        Review of Systems  Constitutional: Negative.   Respiratory: Negative.   Cardiovascular: Negative.   Gastrointestinal: Positive for abdominal pain. Negative for nausea, vomiting, diarrhea and constipation.  Genitourinary: Negative for dysuria, urgency, frequency, hematuria and flank pain.       Negative for vaginal bleeding, contractions  Musculoskeletal: Negative.   Neurological: Negative.   Psychiatric/Behavioral: Negative.    Physical Exam   Blood pressure 116/77, pulse 102, temperature 97.4 F (36.3 C), temperature source Oral, resp. rate 20, height 5\' 9"  (1.753 m), weight 184 lb (83.462 kg), last menstrual period 12/11/2010, SpO2 100.00%.  Physical Exam  Nursing note and vitals reviewed. Constitutional: She is oriented to person, place, and time.  She appears well-developed and well-nourished. No distress.  Cardiovascular: Normal rate.   Respiratory: Effort normal.  GI: Soft. She exhibits no mass. There is no tenderness. There is no rebound and no guarding.  Genitourinary:       SVE: closed/long/high/posterior  Musculoskeletal: Normal range of motion.  Neurological: She is alert and oriented to person, place, and time.  Skin: Skin is warm and dry.  Psychiatric: She has a normal mood and affect.   EFM reactive, TOCO quiet MAU Course  Procedures  Results for orders placed during the hospital encounter of 08/15/11 (from the past 72 hour(s))  URINALYSIS, ROUTINE W REFLEX MICROSCOPIC     Status: Abnormal   Collection Time   08/15/11  2:40 PM      Component Value Range Comment   Color, Urine YELLOW  YELLOW     APPearance CLOUDY (*) CLEAR     Specific Gravity, Urine 1.015  1.005 - 1.030     pH 7.5  5.0 - 8.0     Glucose, UA NEGATIVE  NEGATIVE (mg/dL)    Hgb urine dipstick LARGE (*) NEGATIVE     Bilirubin Urine NEGATIVE  NEGATIVE     Ketones, ur NEGATIVE  NEGATIVE (mg/dL)    Protein, ur NEGATIVE  NEGATIVE (mg/dL)    Urobilinogen, UA 0.2  0.0 - 1.0 (mg/dL)    Nitrite NEGATIVE  NEGATIVE     Leukocytes, UA NEGATIVE  NEGATIVE    URINE  MICROSCOPIC-ADD ON     Status: Abnormal   Collection Time   08/15/11  2:40 PM      Component Value Range Comment   Squamous Epithelial / LPF RARE  RARE     WBC, UA 0-2  <3 (WBC/hpf)    RBC / HPF 21-50  <3 (RBC/hpf)    Bacteria, UA FEW (*) RARE    URINE CULTURE     Status: Normal   Collection Time   08/15/11  2:40 PM      Component Value Range Comment   Specimen Description OB CLEAN CATCH      Special Requests NONE      Culture  Setup Time [id number]      Colony Count NO GROWTH      Culture NO GROWTH      Report Status 08/16/2011 FINAL        Assessment and Plan  26 y.o. G3P1011 at [redacted]w[redacted]d No evidence of preterm labor F/U as scheduled, precautions rev'd  [name] 08/15/2011, 4:16  PM

## 2011-08-16 LAB — URINE CULTURE: Culture  Setup Time: 201304292203

## 2011-09-28 LAB — OB RESULTS CONSOLE GBS: GBS: NEGATIVE

## 2011-10-12 ENCOUNTER — Encounter (HOSPITAL_COMMUNITY): Payer: Self-pay | Admitting: *Deleted

## 2011-10-12 ENCOUNTER — Telehealth (HOSPITAL_COMMUNITY): Payer: Self-pay | Admitting: *Deleted

## 2011-10-12 NOTE — Telephone Encounter (Signed)
Preadmission screen  

## 2011-10-14 ENCOUNTER — Encounter (HOSPITAL_COMMUNITY): Payer: Self-pay | Admitting: *Deleted

## 2011-10-14 ENCOUNTER — Telehealth (HOSPITAL_COMMUNITY): Payer: Self-pay | Admitting: *Deleted

## 2011-10-14 NOTE — Telephone Encounter (Signed)
Preadmission screen  

## 2011-10-18 ENCOUNTER — Encounter (HOSPITAL_COMMUNITY): Payer: Self-pay

## 2011-10-18 ENCOUNTER — Inpatient Hospital Stay (HOSPITAL_COMMUNITY)
Admission: RE | Admit: 2011-10-18 | Discharge: 2011-10-20 | DRG: 767 | Disposition: A | Discharge status: Home / Self Care | Payer: 59 | Source: Ambulatory Visit | Attending: Obstetrics and Gynecology | Admitting: Obstetrics and Gynecology

## 2011-10-18 ENCOUNTER — Inpatient Hospital Stay (HOSPITAL_COMMUNITY): Payer: 59 | Admitting: Anesthesiology

## 2011-10-18 ENCOUNTER — Encounter (HOSPITAL_COMMUNITY): Payer: Self-pay | Admitting: Anesthesiology

## 2011-10-18 DIAGNOSIS — Z302 Encounter for sterilization: Secondary | ICD-10-CM

## 2011-10-18 LAB — CBC
Hemoglobin: 11.3 g/dL — ABNORMAL LOW (ref 12.0–15.0)
MCV: 88.8 fL (ref 78.0–100.0)
Platelets: 313 10*3/uL (ref 150–400)
RBC: 3.93 MIL/uL (ref 3.87–5.11)
WBC: 10.6 10*3/uL — ABNORMAL HIGH (ref 4.0–10.5)

## 2011-10-18 LAB — RPR: RPR Ser Ql: NONREACTIVE

## 2011-10-18 MED ORDER — EPHEDRINE 5 MG/ML INJ
10.0000 mg | INTRAVENOUS | Status: DC | PRN
  Administered 2011-10-18: 10 mg via INTRAVENOUS
  Filled 2011-10-18: qty 4

## 2011-10-18 MED ORDER — FENTANYL 2.5 MCG/ML BUPIVACAINE 1/10 % EPIDURAL INFUSION (WH - ANES)
INTRAMUSCULAR | Status: DC | PRN
  Administered 2011-10-18: 14 mL/h
  Administered 2011-10-18: 16 mL/h via EPIDURAL
  Administered 2011-10-18: 18:00:00

## 2011-10-18 MED ORDER — FLEET ENEMA 7-19 GM/118ML RE ENEM: 1.0000 | ENEMA | RECTAL | Status: DC | PRN

## 2011-10-18 MED ORDER — PHENYLEPHRINE 40 MCG/ML (10ML) SYRINGE FOR IV PUSH (FOR BLOOD PRESSURE SUPPORT)
80.0000 ug | PREFILLED_SYRINGE | INTRAVENOUS | Status: DC | PRN
  Administered 2011-10-18: 80 ug via INTRAVENOUS
  Filled 2011-10-18: qty 5

## 2011-10-18 MED ORDER — OXYCODONE-ACETAMINOPHEN 5-325 MG PO TABS: 1.0000 | ORAL_TABLET | ORAL | Status: DC | PRN

## 2011-10-18 MED ORDER — EPHEDRINE 5 MG/ML INJ: 10.0000 mg | INTRAVENOUS | Status: DC | PRN

## 2011-10-18 MED ORDER — LACTATED RINGERS IV SOLN: 500.0000 mL | INTRAVENOUS | Status: DC | PRN

## 2011-10-18 MED ORDER — LACTATED RINGERS IV SOLN: 500.0000 mL | Freq: Once | INTRAVENOUS | Status: DC

## 2011-10-18 MED ORDER — OXYTOCIN BOLUS FROM INFUSION
250.0000 mL | Freq: Once | INTRAVENOUS | Status: DC
  Filled 2011-10-18: qty 500

## 2011-10-18 MED ORDER — ACETAMINOPHEN 325 MG PO TABS: 650.0000 mg | ORAL_TABLET | ORAL | Status: DC | PRN

## 2011-10-18 MED ORDER — FENTANYL 2.5 MCG/ML BUPIVACAINE 1/10 % EPIDURAL INFUSION (WH - ANES)
14.0000 mL/h | INTRAMUSCULAR | Status: DC
  Filled 2011-10-18 (×3): qty 60

## 2011-10-18 MED ORDER — BUTORPHANOL TARTRATE 2 MG/ML IJ SOLN: 1.0000 mg | INTRAMUSCULAR | Status: DC | PRN

## 2011-10-18 MED ORDER — CITRIC ACID-SODIUM CITRATE 334-500 MG/5ML PO SOLN
30.0000 mL | ORAL | Status: DC | PRN
  Administered 2011-10-18 (×2): 30 mL via ORAL
  Filled 2011-10-18 (×2): qty 15

## 2011-10-18 MED ORDER — TERBUTALINE SULFATE 1 MG/ML IJ SOLN: 0.2500 mg | Freq: Once | INTRAMUSCULAR | Status: DC | PRN

## 2011-10-18 MED ORDER — IBUPROFEN 600 MG PO TABS: 600.0000 mg | ORAL_TABLET | Freq: Four times a day (QID) | ORAL | Status: DC | PRN

## 2011-10-18 MED ORDER — DIPHENHYDRAMINE HCL 50 MG/ML IJ SOLN
12.5000 mg | INTRAMUSCULAR | Status: DC | PRN
Start: 2011-10-18 — End: 2011-10-18

## 2011-10-18 MED ORDER — ONDANSETRON HCL 4 MG/2ML IJ SOLN: 4.0000 mg | Freq: Four times a day (QID) | INTRAMUSCULAR | Status: DC | PRN

## 2011-10-18 MED ORDER — OXYTOCIN 40 UNITS IN LACTATED RINGERS INFUSION - SIMPLE MED
1.0000 m[IU]/min | INTRAVENOUS | Status: DC
  Administered 2011-10-18: 2 m[IU]/min via INTRAVENOUS
  Filled 2011-10-18: qty 1000

## 2011-10-18 MED ORDER — OXYTOCIN 40 UNITS IN LACTATED RINGERS INFUSION - SIMPLE MED
62.5000 mL/h | Freq: Once | INTRAVENOUS | Status: AC
  Administered 2011-10-18: 62.5 mL/h via INTRAVENOUS

## 2011-10-18 MED ORDER — LACTATED RINGERS IV SOLN
INTRAVENOUS | Status: DC
  Administered 2011-10-18 (×3): via INTRAVENOUS

## 2011-10-18 MED ORDER — PHENYLEPHRINE 40 MCG/ML (10ML) SYRINGE FOR IV PUSH (FOR BLOOD PRESSURE SUPPORT): 80.0000 ug | PREFILLED_SYRINGE | INTRAVENOUS | Status: DC | PRN

## 2011-10-18 MED ORDER — LIDOCAINE HCL (PF) 1 % IJ SOLN
INTRAMUSCULAR | Status: DC | PRN
  Administered 2011-10-18: 4 mL
  Administered 2011-10-18: 5 mL

## 2011-10-18 MED ORDER — LIDOCAINE HCL (PF) 1 % IJ SOLN
30.0000 mL | INTRAMUSCULAR | Status: DC | PRN
  Filled 2011-10-18: qty 30

## 2011-10-18 NOTE — Progress Notes (Signed)
In to assess pt. Contractions are mild per patient  Cervix 2/50/ -1 Arom clear fluid , IUPC placed  FHR baseline 130's good btbv +accels no decels  Toco ctx q3-5 minutes  A/P 39 wks and 3 days  For social induction Continue pitocin  Anticipate svd

## 2011-10-18 NOTE — Progress Notes (Signed)
In to assess patient.  cx 6/100/0  FHR baseline 130's good btbv + accels no decels  Toco ctx q2-3 minutes  A./P 39wks and 3 days  Continue pitocin Anticipate svd

## 2011-10-18 NOTE — Anesthesia Preprocedure Evaluation (Signed)
Anesthesia Evaluation  Patient identified by MRN, date of birth, ID band Patient awake    Reviewed: Allergy & Precautions, H&P , Patient's Chart, lab work & pertinent test results  Airway Mallampati: III TM Distance: >3 FB Neck ROM: full    Dental No notable dental hx. (+) Teeth Intact   Pulmonary neg pulmonary ROS,  breath sounds clear to auscultation  Pulmonary exam normal       Cardiovascular Rhythm:regular Rate:Normal  PIH   Neuro/Psych Depression negative neurological ROS     GI/Hepatic Neg liver ROS, GERD-  Medicated and Controlled,  Endo/Other  negative endocrine ROS  Renal/GU negative Renal ROS  negative genitourinary   Musculoskeletal   Abdominal   Peds  Hematology negative hematology ROS (+)   Anesthesia Other Findings   Reproductive/Obstetrics (+) Pregnancy                           Anesthesia Physical Anesthesia Plan  ASA: II  Anesthesia Plan: Epidural   Post-op Pain Management:    Induction:   Airway Management Planned:   Additional Equipment:   Intra-op Plan:   Post-operative Plan:   Informed Consent: I have reviewed the patients History and Physical, chart, labs and discussed the procedure including the risks, benefits and alternatives for the proposed anesthesia with the patient or authorized representative who has indicated his/her understanding and acceptance.     Plan Discussed with: Anesthesiologist and Surgeon  Anesthesia Plan Comments:         Anesthesia Quick Evaluation

## 2011-10-18 NOTE — Anesthesia Procedure Notes (Signed)
Epidural Patient location during procedure: OB Start time: 10/18/2011 2:16 PM  Staffing Anesthesiologist: Ligaya Cormier A. Performed by: anesthesiologist   Preanesthetic Checklist Completed: patient identified, site marked, surgical consent, pre-op evaluation, timeout performed, IV checked, risks and benefits discussed and monitors and equipment checked  Epidural Patient position: sitting Prep: site prepped and draped and DuraPrep Patient monitoring: continuous pulse ox and blood pressure Approach: midline Injection technique: LOR air  Needle:  Needle type: Tuohy  Needle gauge: 17 G Needle length: 9 cm Needle insertion depth: 5 cm cm Catheter type: closed end flexible Catheter size: 19 Gauge Catheter at skin depth: 10 cm Test dose: negative and Other  Assessment Events: blood not aspirated, injection not painful, no injection resistance, negative IV test and no paresthesia  Additional Notes Patient identified. Risks and benefits discussed including failed block, incomplete  Pain control, post dural puncture headache, nerve damage, paralysis, blood pressure Changes, nausea, vomiting, reactions to medications-both toxic and allergic and post Partum back pain. All questions were answered. Patient expressed understanding and wished to proceed. Sterile technique was used throughout procedure. Epidural site was Dressed with sterile barrier dressing. No paresthesias, signs of intravascular injection Or signs of intrathecal spread were encountered.  Patient was more comfortable after the epidural was dosed. Please see RN's note for documentation of vital signs and FHR which are stable.

## 2011-10-18 NOTE — H&P (Signed)
Shannon [name] is a 26 y.o. female presenting for elective social induction. At 39 wks and 3 days. Pt desires postpartum tubal ligation. + FM no lof no vaginal bleeding. No contractions.   OB History    Grav Para Term Preterm Abortions TAB SAB Ect Mult Living   3 1 1  1  0 1 0 0 1     Past Medical History  Diagnosis Date  . PIH (pregnancy induced hypertension)   . Pregnancy induced hypertension   . Depression     ppd   Past Surgical History  Procedure Date  . Tonsillectomy   . Wisdom tooth extraction    Family History: family history includes Bipolar disorder in her mother; Heart disease in her maternal grandfather; and Hypertension in her maternal grandfather.  There is no history of Anesthesia problems. Social History:  reports that she quit smoking about 2 years ago. She has never used smokeless tobacco. She reports that she does not drink alcohol or use illicit drugs.  ROS negative except as stated in in hpi   Dilation: 1.5 Effacement (%): 50 Station: -1 Exam by:: Korah Hufstedler Blood pressure 121/75, pulse 68, temperature 98.3 F (36.8 C), temperature source Oral, height 5\' 9"  (1.753 m), weight 87.091 kg (192 lb), last menstrual period 12/11/2010. CV rrr  Lungs Clear  Abd gravid  nontender  Ext no edema   FHR baseline 140's reactive no decels   Prenatal labs: ABO, Rh:   A positive   Antibody:  Negative  Rubella:   IMMUNE  RPR: Nonreactive (11/26 0000)  HBsAg: NEGATIVE (03/24 1835)  HIV: Non-reactive (11/26 0000)  GBS: Negative (06/12 0000)   Assessment/Plan: 39 wks and 3 days for social induction. Pt is informed of increased r/o cesarean section with induction and accepts this risk. She desire pp tubal ligation. R/ b/ a/ of btl discussed with patient including but not limited to infection/ bleeding / damage to bowel bladder and surrounding organs with need for further surgery. R/(O failure 1 % with 50 % r/o ectopic if failure occurs. Pt voided understanding.    Tranell Wojtkiewicz  J. 10/18/2011, 11:13 AM

## 2011-10-19 ENCOUNTER — Encounter (HOSPITAL_COMMUNITY)
Admission: RE | Disposition: A | Discharge status: Home / Self Care | Payer: Self-pay | Source: Ambulatory Visit | Attending: Obstetrics and Gynecology

## 2011-10-19 ENCOUNTER — Encounter (HOSPITAL_COMMUNITY): Payer: Self-pay | Admitting: Anesthesiology

## 2011-10-19 ENCOUNTER — Inpatient Hospital Stay (HOSPITAL_COMMUNITY): Payer: 59 | Admitting: Anesthesiology

## 2011-10-19 HISTORY — PX: TUBAL LIGATION: SHX77

## 2011-10-19 LAB — CBC
Platelets: 268 10*3/uL (ref 150–400)
RBC: 3.17 MIL/uL — ABNORMAL LOW (ref 3.87–5.11)
WBC: 19.4 10*3/uL — ABNORMAL HIGH (ref 4.0–10.5)

## 2011-10-19 LAB — SURGICAL PCR SCREEN: MRSA, PCR: NEGATIVE

## 2011-10-19 SURGERY — LIGATION, FALLOPIAN TUBE, POSTPARTUM
Anesthesia: Epidural | Site: Abdomen | Laterality: Bilateral | Wound class: Clean

## 2011-10-19 MED ORDER — TETANUS-DIPHTH-ACELL PERTUSSIS 5-2.5-18.5 LF-MCG/0.5 IM SUSP
0.5000 mL | Freq: Once | INTRAMUSCULAR | Status: AC
  Administered 2011-10-20: 0.5 mL via INTRAMUSCULAR
  Filled 2011-10-19: qty 0.5

## 2011-10-19 MED ORDER — OXYCODONE-ACETAMINOPHEN 5-325 MG PO TABS
1.0000 | ORAL_TABLET | ORAL | Status: DC | PRN
  Administered 2011-10-19: 2 via ORAL
  Administered 2011-10-19 – 2011-10-20 (×4): 1 via ORAL
  Filled 2011-10-19 (×4): qty 1
  Filled 2011-10-19: qty 2

## 2011-10-19 MED ORDER — SENNOSIDES-DOCUSATE SODIUM 8.6-50 MG PO TABS
2.0000 | ORAL_TABLET | Freq: Every day | ORAL | Status: DC
  Administered 2011-10-19: 2 via ORAL

## 2011-10-19 MED ORDER — SIMETHICONE 80 MG PO CHEW: 80.0000 mg | CHEWABLE_TABLET | ORAL | Status: DC | PRN

## 2011-10-19 MED ORDER — FENTANYL CITRATE 0.05 MG/ML IJ SOLN
INTRAMUSCULAR | Status: AC
  Filled 2011-10-19: qty 2

## 2011-10-19 MED ORDER — METHYLERGONOVINE MALEATE 0.2 MG/ML IJ SOLN: 0.2000 mg | INTRAMUSCULAR | Status: DC | PRN

## 2011-10-19 MED ORDER — DEXAMETHASONE SODIUM PHOSPHATE 10 MG/ML IJ SOLN
INTRAMUSCULAR | Status: AC
  Filled 2011-10-19: qty 1

## 2011-10-19 MED ORDER — LANOLIN HYDROUS EX OINT: TOPICAL_OINTMENT | CUTANEOUS | Status: DC | PRN

## 2011-10-19 MED ORDER — PSEUDOEPHEDRINE HCL 30 MG PO TABS
30.0000 mg | ORAL_TABLET | Freq: Two times a day (BID) | ORAL | Status: DC | PRN
  Administered 2011-10-19: 30 mg via ORAL
  Filled 2011-10-19: qty 1

## 2011-10-19 MED ORDER — ONDANSETRON HCL 4 MG/2ML IJ SOLN
INTRAMUSCULAR | Status: AC
  Filled 2011-10-19: qty 2

## 2011-10-19 MED ORDER — ONDANSETRON HCL 4 MG/2ML IJ SOLN: 4.0000 mg | INTRAMUSCULAR | Status: DC | PRN

## 2011-10-19 MED ORDER — ONDANSETRON HCL 4 MG PO TABS: 4.0000 mg | ORAL_TABLET | ORAL | Status: DC | PRN

## 2011-10-19 MED ORDER — DEXAMETHASONE SODIUM PHOSPHATE 4 MG/ML IJ SOLN
INTRAMUSCULAR | Status: DC | PRN
  Administered 2011-10-19: 10 mg via INTRAVENOUS

## 2011-10-19 MED ORDER — SODIUM BICARBONATE 8.4 % IV SOLN
INTRAVENOUS | Status: AC
  Filled 2011-10-19: qty 50

## 2011-10-19 MED ORDER — METOCLOPRAMIDE HCL 10 MG PO TABS
10.0000 mg | ORAL_TABLET | Freq: Once | ORAL | Status: AC
  Administered 2011-10-19: 10 mg via ORAL
  Filled 2011-10-19: qty 1

## 2011-10-19 MED ORDER — FAMOTIDINE 20 MG PO TABS
40.0000 mg | ORAL_TABLET | Freq: Once | ORAL | Status: AC
  Administered 2011-10-19: 40 mg via ORAL
  Filled 2011-10-19: qty 2

## 2011-10-19 MED ORDER — PRENATAL MULTIVITAMIN CH
1.0000 | ORAL_TABLET | Freq: Every day | ORAL | Status: DC
  Administered 2011-10-20: 1 via ORAL
  Filled 2011-10-19 (×2): qty 1

## 2011-10-19 MED ORDER — BUPIVACAINE HCL (PF) 0.25 % IJ SOLN
INTRAMUSCULAR | Status: AC
  Filled 2011-10-19: qty 30

## 2011-10-19 MED ORDER — ONDANSETRON HCL 4 MG/2ML IJ SOLN
INTRAMUSCULAR | Status: DC | PRN
  Administered 2011-10-19: 4 mg via INTRAVENOUS

## 2011-10-19 MED ORDER — LIDOCAINE-EPINEPHRINE (PF) 2 %-1:200000 IJ SOLN
INTRAMUSCULAR | Status: AC
  Filled 2011-10-19: qty 20

## 2011-10-19 MED ORDER — ZOLPIDEM TARTRATE 5 MG PO TABS: 5.0000 mg | ORAL_TABLET | Freq: Every evening | ORAL | Status: DC | PRN

## 2011-10-19 MED ORDER — DIBUCAINE 1 % RE OINT: 1.0000 "application " | TOPICAL_OINTMENT | RECTAL | Status: DC | PRN

## 2011-10-19 MED ORDER — METHYLERGONOVINE MALEATE 0.2 MG PO TABS: 0.2000 mg | ORAL_TABLET | ORAL | Status: DC | PRN

## 2011-10-19 MED ORDER — LACTATED RINGERS IV SOLN
INTRAVENOUS | Status: DC
  Administered 2011-10-19 (×2): via INTRAVENOUS

## 2011-10-19 MED ORDER — HYDROMORPHONE HCL PF 1 MG/ML IJ SOLN: 0.2500 mg | INTRAMUSCULAR | Status: DC | PRN

## 2011-10-19 MED ORDER — MIDAZOLAM HCL 5 MG/5ML IJ SOLN
INTRAMUSCULAR | Status: DC | PRN
  Administered 2011-10-19 (×2): 1 mg via INTRAVENOUS

## 2011-10-19 MED ORDER — PROPOFOL 10 MG/ML IV EMUL
INTRAVENOUS | Status: AC
  Filled 2011-10-19: qty 20

## 2011-10-19 MED ORDER — BUPIVACAINE HCL (PF) 0.25 % IJ SOLN
INTRAMUSCULAR | Status: DC | PRN
  Administered 2011-10-19: 20 mL

## 2011-10-19 MED ORDER — WITCH HAZEL-GLYCERIN EX PADS: 1.0000 "application " | MEDICATED_PAD | CUTANEOUS | Status: DC | PRN

## 2011-10-19 MED ORDER — SODIUM BICARBONATE 8.4 % IV SOLN
INTRAVENOUS | Status: DC | PRN
  Administered 2011-10-19: 3 mL via EPIDURAL

## 2011-10-19 MED ORDER — PROMETHAZINE HCL 25 MG/ML IJ SOLN: 6.2500 mg | INTRAMUSCULAR | Status: DC | PRN

## 2011-10-19 MED ORDER — PSEUDOEPHEDRINE HCL 30 MG/5ML PO SYRP: 30.0000 mg | ORAL_SOLUTION | Freq: Two times a day (BID) | ORAL | Status: DC | PRN

## 2011-10-19 MED ORDER — CLINDAMYCIN PHOSPHATE 900 MG/50ML IV SOLN
900.0000 mg | INTRAVENOUS | Status: AC
  Administered 2011-10-19: 900 mg via INTRAVENOUS
  Filled 2011-10-19: qty 50

## 2011-10-19 MED ORDER — DIPHENHYDRAMINE HCL 25 MG PO CAPS: 25.0000 mg | ORAL_CAPSULE | Freq: Four times a day (QID) | ORAL | Status: DC | PRN

## 2011-10-19 MED ORDER — KETOROLAC TROMETHAMINE 30 MG/ML IJ SOLN: 15.0000 mg | Freq: Once | INTRAMUSCULAR | Status: DC | PRN

## 2011-10-19 MED ORDER — BENZOCAINE-MENTHOL 20-0.5 % EX AERO
1.0000 "application " | INHALATION_SPRAY | CUTANEOUS | Status: DC | PRN
  Administered 2011-10-19: 1 via TOPICAL
  Filled 2011-10-19: qty 56

## 2011-10-19 MED ORDER — MIDAZOLAM HCL 2 MG/2ML IJ SOLN
INTRAMUSCULAR | Status: AC
  Filled 2011-10-19: qty 2

## 2011-10-19 MED ORDER — PROPOFOL 10 MG/ML IV BOLUS
INTRAVENOUS | Status: DC | PRN
  Administered 2011-10-19 (×7): 10 mg via INTRAVENOUS

## 2011-10-19 MED ORDER — GUAIFENESIN 100 MG/5ML PO SOLN
15.0000 mL | ORAL | Status: DC | PRN
  Administered 2011-10-19 – 2011-10-20 (×3): 300 mg via ORAL
  Filled 2011-10-19 (×3): qty 15

## 2011-10-19 MED ORDER — CIPROFLOXACIN IN D5W 400 MG/200ML IV SOLN
400.0000 mg | INTRAVENOUS | Status: AC
  Administered 2011-10-19: 400 mg via INTRAVENOUS
  Filled 2011-10-19: qty 200

## 2011-10-19 MED ORDER — FENTANYL CITRATE 0.05 MG/ML IJ SOLN
INTRAMUSCULAR | Status: DC | PRN
  Administered 2011-10-19 (×2): 50 ug via INTRAVENOUS

## 2011-10-19 MED ORDER — IBUPROFEN 600 MG PO TABS
600.0000 mg | ORAL_TABLET | Freq: Four times a day (QID) | ORAL | Status: DC
  Administered 2011-10-19 – 2011-10-20 (×5): 600 mg via ORAL
  Filled 2011-10-19 (×5): qty 1

## 2011-10-19 MED ORDER — MEPERIDINE HCL 25 MG/ML IJ SOLN: 6.2500 mg | INTRAMUSCULAR | Status: DC | PRN

## 2011-10-19 SURGICAL SUPPLY — 23 items
CLIP FILSHIE TUBAL LIGA STRL (Clip) ×2 IMPLANT
CLOTH BEACON ORANGE TIMEOUT ST (SAFETY) ×2 IMPLANT
CONTAINER PREFILL 10% NBF 15ML (MISCELLANEOUS) ×4 IMPLANT
DERMABOND ADVANCED (GAUZE/BANDAGES/DRESSINGS) ×1
DERMABOND ADVANCED .7 DNX12 (GAUZE/BANDAGES/DRESSINGS) ×1 IMPLANT
ELECT REM PT RETURN 9FT ADLT (ELECTROSURGICAL) ×2
ELECTRODE REM PT RTRN 9FT ADLT (ELECTROSURGICAL) ×1 IMPLANT
GLOVE BIOGEL M 6.5 STRL (GLOVE) ×4 IMPLANT
GLOVE BIOGEL PI IND STRL 6.5 (GLOVE) ×1 IMPLANT
GLOVE BIOGEL PI INDICATOR 6.5 (GLOVE) ×1
GOWN PREVENTION PLUS LG XLONG (DISPOSABLE) ×4 IMPLANT
NEEDLE HYPO 25X1 1.5 SAFETY (NEEDLE) ×2 IMPLANT
NS IRRIG 1000ML POUR BTL (IV SOLUTION) ×2 IMPLANT
PACK ABDOMINAL MINOR (CUSTOM PROCEDURE TRAY) ×2 IMPLANT
PENCIL BUTTON HOLSTER BLD 10FT (ELECTRODE) ×2 IMPLANT
SLEEVE SCD COMPRESS KNEE MED (MISCELLANEOUS) IMPLANT
SPONGE LAP 4X18 X RAY DECT (DISPOSABLE) IMPLANT
SUT VICRYL 0 UR6 27IN ABS (SUTURE) ×2 IMPLANT
SUT VICRYL 4-0 PS2 18IN ABS (SUTURE) ×2 IMPLANT
SYR CONTROL 10ML LL (SYRINGE) ×2 IMPLANT
TOWEL OR 17X24 6PK STRL BLUE (TOWEL DISPOSABLE) ×4 IMPLANT
TRAY FOLEY CATH 14FR (SET/KITS/TRAYS/PACK) ×2 IMPLANT
WATER STERILE IRR 1000ML POUR (IV SOLUTION) ×2 IMPLANT

## 2011-10-19 NOTE — Op Note (Signed)
10/18/2011 - 10/19/2011  2:23 PM  PATIENT:  Shannon [name]  26 y.o. female  PRE-OPERATIVE DIAGNOSIS:  Desires permanent sterilization  POST-OPERATIVE DIAGNOSIS:  Desires permanent sterilization  PROCEDURE:  Procedure(s) (LRB): POST PARTUM TUBAL LIGATION (Bilateral)  SURGEON:  Surgeon(s) and Role:    * Kashmere Staffa J. Richardson Dopp, MD - Primary  PHYSICIAN ASSISTANT:   ASSISTANTS: none   ANESTHESIA:   epidural  EBL:  Total I/O In: 700 [I.V.:700] Out: 210 [Urine:200; Blood:10]  BLOOD ADMINISTERED:none  DRAINS: none   LOCAL MEDICATIONS USED:  MARCAINE     SPECIMEN:  No Specimen  DISPOSITION OF SPECIMEN:  N/A  COUNTS:  YES  TOURNIQUET:  * No tourniquets in log *  DICTATION: .Note written in EPIC  PLAN OF CARE: Admit to inpatient   PATIENT DISPOSITION:  PACU - hemodynamically stable.   Delay start of Pharmacological VTE agent (>24hrs) due to surgical blood loss or risk of bleeding: not applicable  Procedure: pt as taken to the operating room where her epidural was dosed and found to be adequate. She was prepped and draped in the usual sterile fashion. An infraumbilical incision was made with the scapel and carried down to the underlying layer of fascia. The fascia was incised. The perinuem was identified tented up and entered with scissors.the right fallopian tube was identified and followed out to the fimbriated end a Filshie clip was placed  Along the ampullary  portion of the right fallopian  tube without difficulty.  The left fallopian tube was identified followed out to the fimbriated end. A Filshie clip was placed along the ampullary portion of the left fallopian tube. 5 cc of quarter percent Marcaine was placed along the ampullary portion of the tubes  Bilaterally.  The fascia was closed with 0 Vicryl.  The skin was closed with 4-0 Vicryl. The dermabond placed a over the skin incision.  Patient was awakened from anesthesia taken to the recovery room awake and in stable condition.

## 2011-10-19 NOTE — Anesthesia Postprocedure Evaluation (Signed)
Anesthesia Post Note  Patient: Shannon [name]  Procedure(s) Performed: Procedure(s) (LRB): POST PARTUM TUBAL LIGATION (Bilateral)  Anesthesia type: Epidural  Patient location: PACU  Post pain: Pain level controlled  Post assessment: Post-op Vital signs reviewed  Last Vitals:  Filed Vitals:   10/19/11 1016  BP: 111/76  Pulse: 84  Temp: 36.8 C  Resp: 16    Post vital signs: Reviewed  Level of consciousness: awake  Complications: No apparent anesthesia complications

## 2011-10-19 NOTE — Anesthesia Preprocedure Evaluation (Signed)
Anesthesia Evaluation  Patient identified by MRN, date of birth, ID band Patient awake    Reviewed: Allergy & Precautions, H&P , NPO status , Patient's Chart, lab work & pertinent test results  Airway Mallampati: III TM Distance: >3 FB Neck ROM: full    Dental No notable dental hx. (+) Teeth Intact   Pulmonary neg pulmonary ROS,  breath sounds clear to auscultation  Pulmonary exam normal       Cardiovascular hypertension, Rhythm:regular Rate:Normal  PIH   Neuro/Psych Depression negative neurological ROS     GI/Hepatic Neg liver ROS, GERD-  Medicated and Controlled,  Endo/Other  negative endocrine ROS  Renal/GU negative Renal ROS  negative genitourinary   Musculoskeletal   Abdominal   Peds  Hematology negative hematology ROS (+)   Anesthesia Other Findings   Reproductive/Obstetrics negative OB ROS                           Anesthesia Physical  Anesthesia Plan  ASA: II  Anesthesia Plan: Epidural   Post-op Pain Management:    Induction:   Airway Management Planned:   Additional Equipment:   Intra-op Plan:   Post-operative Plan:   Informed Consent: I have reviewed the patients History and Physical, chart, labs and discussed the procedure including the risks, benefits and alternatives for the proposed anesthesia with the patient or authorized representative who has indicated his/her understanding and acceptance.     Plan Discussed with: Anesthesiologist and Surgeon  Anesthesia Plan Comments:         Anesthesia Quick Evaluation

## 2011-10-19 NOTE — H&P (Signed)
Date of Initial H&P: 10/18/2011 History reviewed, patient examined, no change in status, stable for surgery.  Pt is s/p vaginal delivery on 7/2/203 and desires permanent sterilization. I reviewed with the patient r/b/a of postpartum tubal ligation including infection/ bleeding damage to bowel bladder and surrounding organs with the need for further surgery. Pt is informed that this is a permanent form of sterilization. She is informed of 1 % r/o failure with 50 % r/o ectopic, which can be life threatening,  if failure occurs. She voiced understanding and desires to proceed with postpartum tubal ligation.

## 2011-10-19 NOTE — Transfer of Care (Signed)
Immediate Anesthesia Transfer of Care Note  Patient: [facility]  Procedure(s) Performed: Procedure(s) (LRB): POST PARTUM TUBAL LIGATION (Bilateral)  Patient Location: PACU  Anesthesia Type: Epidural  Level of Consciousness: awake, alert  and oriented  Airway & Oxygen Therapy: Patient Spontanous Breathing and Patient connected to nasal cannula oxygen  Post-op Assessment: Report given to PACU RN and Post -op Vital signs reviewed and stable  Post vital signs: Reviewed and stable  Complications: No apparent anesthesia complications

## 2011-10-20 ENCOUNTER — Encounter (HOSPITAL_COMMUNITY): Payer: Self-pay | Admitting: Obstetrics and Gynecology

## 2011-10-20 MED ORDER — IBUPROFEN 600 MG PO TABS: 600.0000 mg | ORAL_TABLET | Freq: Four times a day (QID) | ORAL | Status: AC

## 2011-10-20 MED ORDER — OXYCODONE-ACETAMINOPHEN 5-325 MG PO TABS: ORAL_TABLET | ORAL | Status: DC

## 2011-10-20 NOTE — Discharge Summary (Signed)
Obstetric Discharge Summary Reason for Admission: induction of labor Prenatal Procedures: ultrasound Intrapartum Procedures: spontaneous vaginal delivery Postpartum Procedures: none Complications-Operative and Postpartum: periurethral laceration, repaired, Postpartum tubal ligation. Hemoglobin  Date Value Range Status  10/19/2011 9.5* 12.0 - 15.0 g/dL Final     HCT  Date Value Range Status  10/19/2011 28.5* 36.0 - 46.0 % Final    Physical Exam:  General: alert, cooperative and no distress Lochia: appropriate Uterine Fundus: firm Incision: healing well, no significant drainage DVT Evaluation: No evidence of DVT seen on physical exam.  Discharge Diagnoses: Term Pregnancy-delivered  Discharge Information: Date: 10/20/2011 Activity: pelvic rest Diet: routine Medications: PNV, Ibuprofen and Percocet Condition: stable Instructions: See discharge instructions Discharge to: home Follow-up Information    Follow up with Jessee Avers., MD. Schedule an appointment as soon as possible for a visit in 6 weeks. (Postpartum check)    Contact information:   [address] [place] [address] [phone]       Follow up with Jessee Avers., MD in 2 weeks. [facility] check up)    Contact information:   [address] [place] [address] [phone]          Newborn Data: Live born female  Birth Weight: 6 lb 14.1 oz (3120 g) APGAR: 8, 9  Home with mother.  Shannon [name] 10/20/2011, 11:16 AM

## 2011-10-20 NOTE — Anesthesia Postprocedure Evaluation (Signed)
  Anesthesia Post-op Note  Patient: [employer]  Procedure(s) Performed: * Lumbar Epidural for L&D *   Patient Location: Short Stay  Anesthesia Type: Epidural  Level of Consciousness: awake, alert  and oriented  Airway and Oxygen Therapy: Patient Spontanous Breathing  Post-op Pain: none  Post-op Assessment: Post-op Vital signs reviewed, Patient's Cardiovascular Status Stable, Respiratory Function Stable, Patent Airway, No signs of Nausea or vomiting, Pain level controlled, No headache, No backache, No residual numbness and No residual motor weakness  Post-op Vital Signs: Reviewed and stable  Complications: No apparent anesthesia complications
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
## 2011-10-20 NOTE — Addendum Note (Signed)
Addendum  created 10/20/11 1610 by Lincoln Brigham, CRNA   Modules edited:Notes Section

## 2011-10-20 NOTE — Anesthesia Postprocedure Evaluation (Signed)
  Anesthesia Post-op Note  Patient: [employer]  Procedure(s) Performed: Procedure(s) (LRB): POST PARTUM TUBAL LIGATION (Bilateral)  Patient Location: Mother/Baby  Anesthesia Type: Epidural  Level of Consciousness: awake, alert  and oriented  Airway and Oxygen Therapy: Patient Spontanous Breathing  Post-op Pain: mild  Post-op Assessment: Patient's Cardiovascular Status Stable, Respiratory Function Stable, Patent Airway, No signs of Nausea or vomiting and Pain level controlled  Post-op Vital Signs: stable  Complications: No apparent anesthesia complications

## 2011-10-20 NOTE — Progress Notes (Signed)
Post Partum Day 2 Subjective: Complaint of incisional pain and uterine cramping with nursing.  Relieved with medication.  Pt with a h/o depression.  She initially states she had pp depression when she had to leave baby to go to work around 6 weeks.  Objective: Blood pressure 106/68, pulse 77, temperature 98 F (36.7 C), temperature source Oral, resp. rate 16, height 5\' 9"  (1.753 m), weight 87.091 kg (192 lb), last menstrual period 12/11/2010, SpO2 97.00%, unknown if currently breastfeeding.  Physical Exam:  General: alert, cooperative and no distress Lochia: appropriate Uterine Fundus: firm Incision: healing well, no significant drainage DVT Evaluation: No evidence of DVT seen on physical exam.   Basename 10/19/11 0535 10/18/11 0805  HGB 9.5* 11.3*  HCT 28.5* 34.9*    Assessment/Plan: Discharge home PP depression precautions.   F/u with Dr. Richardson Dopp in 2 weeks for depression screening. Percocet for incisional pain.   LOS: 2 days   Rosalee Tolley 10/20/2011, 11:08 AM

## 2012-02-23 ENCOUNTER — Emergency Department (HOSPITAL_COMMUNITY)
Admission: EM | Admit: 2012-02-23 | Discharge: 2012-02-24 | Disposition: A | Discharge status: Home / Self Care | Payer: Self-pay | Attending: Emergency Medicine | Admitting: Emergency Medicine

## 2012-02-23 ENCOUNTER — Encounter (HOSPITAL_COMMUNITY): Payer: Self-pay | Admitting: *Deleted

## 2012-02-23 DIAGNOSIS — Z87891 Personal history of nicotine dependence: Secondary | ICD-10-CM | POA: Insufficient documentation

## 2012-02-23 DIAGNOSIS — K089 Disorder of teeth and supporting structures, unspecified: Secondary | ICD-10-CM | POA: Insufficient documentation

## 2012-02-23 DIAGNOSIS — Z8659 Personal history of other mental and behavioral disorders: Secondary | ICD-10-CM | POA: Insufficient documentation

## 2012-02-23 DIAGNOSIS — K0889 Other specified disorders of teeth and supporting structures: Secondary | ICD-10-CM

## 2012-02-23 DIAGNOSIS — Z9889 Other specified postprocedural states: Secondary | ICD-10-CM | POA: Insufficient documentation

## 2012-02-23 NOTE — ED Notes (Signed)
Pt states she woke up with upper left dental pain this morning, has taken ibuprofen with no relief.

## 2012-02-24 MED ORDER — HYDROCODONE-ACETAMINOPHEN 5-325 MG PO TABS: 1.0000 | ORAL_TABLET | ORAL | Status: DC | PRN

## 2012-02-24 MED ORDER — CLINDAMYCIN HCL 300 MG PO CAPS: 300.0000 mg | ORAL_CAPSULE | Freq: Three times a day (TID) | ORAL | Status: DC

## 2012-02-24 NOTE — ED Provider Notes (Signed)
History     CSN: [id number]  Arrival date & time 02/23/12  2353   First MD Initiated Contact with Patient 02/24/12 0044      Chief Complaint  Patient presents with  . Dental Pain    (Consider location/radiation/quality/duration/timing/severity/associated sxs/prior treatment) HPI History provided by pt.   Pt presents w/ c/o left upper toothache since yesterday morning.  Severe and radiating to ear and mandible.  Temporary relief w/ ibuprofen and wine.  No associated fever.  Denies trauma.   Past Medical History  Diagnosis Date  . PIH (pregnancy induced hypertension)   . Pregnancy induced hypertension   . Depression     ppd    Past Surgical History  Procedure Date  . Tonsillectomy   . Wisdom tooth extraction   . Tubal ligation 10/19/2011    Procedure: POST PARTUM TUBAL LIGATION;  Surgeon: Dorien Chihuahua. Richardson Dopp, MD;  Location: [facility];  Service: Gynecology;  Laterality: Bilateral;    Family History  Problem Relation Age of Onset  . Anesthesia problems Neg Hx   . Bipolar disorder Mother   . Heart disease Maternal Grandfather   . Hypertension Maternal Grandfather     History  Substance Use Topics  . Smoking status: Former Smoker    Quit date: 10/13/2009  . Smokeless tobacco: Never Used  . Alcohol Use: Yes    OB History    Grav Para Term Preterm Abortions TAB SAB Ect Mult Living   3 2 2  1  0 1 0 0 2      Review of Systems  All other systems reviewed and are negative.    Allergies  Penicillins  Home Medications   Current Outpatient Rx  Name  Route  Sig  Dispense  Refill  . BC FAST PAIN RELIEF PO   Oral   Take by mouth every 6 (six) hours as needed. For pain         . IBUPROFEN 600 MG PO TABS   Oral   Take 600 mg by mouth every 6 (six) hours as needed. For pain         . PRENATAL MULTIVITAMIN CH   Oral   Take 1 tablet by mouth at bedtime.         Marland Kitchen CLINDAMYCIN HCL 300 MG PO CAPS   Oral   Take 1 capsule (300 mg total) by mouth 3 (three) times  daily.   21 capsule   0   . HYDROCODONE-ACETAMINOPHEN 5-325 MG PO TABS   Oral   Take 1 tablet by mouth every 4 (four) hours as needed for pain.   20 tablet   0     BP 137/89  Pulse 94  Temp 98.1 F (36.7 C) (Oral)  Resp 16  SpO2 98%  Breastfeeding? Unknown  Physical Exam  Nursing note and vitals reviewed. Constitutional: She is oriented to person, place, and time. She appears well-developed and well-nourished.  HENT:  Head: Normocephalic and atraumatic. No trismus in the jaw.  Mouth/Throat: Uvula is midline and oropharynx is clear and moist.       Filling has fallen out of left upper 2nd molar.  No visible new caries.  Non-tender.  Adjacent gingiva appears normal.  No edema of buccal mucosa.    Eyes:       Normal appearance  Neck: Normal range of motion. Neck supple.       No submandibular edema  Lymphadenopathy:    She has no cervical adenopathy.  Neurological: She is  alert and oriented to person, place, and time.  Psychiatric: She has a normal mood and affect. Her behavior is normal.    ED Course  Procedures (including critical care time)  Labs Reviewed - No data to display No results found.   1. Toothache       MDM  Pt presents w/ left upper toothache.  Will treat for possible periapical abscess w/ clinda (pen allergic) and vicodin.  She will continue ibuprofen and f/u with her dentist asap.  Return precautions discussed.         Arie Sabina [place], [place] 02/24/12 [phone]

## 2012-02-24 NOTE — ED Provider Notes (Signed)
Medical screening examination/treatment/procedure(s) were performed by non-physician practitioner and as supervising physician I was immediately available for consultation/collaboration.  Karli Wickizer M Pa Tennant, MD 02/24/12 0533 

## 2012-02-24 NOTE — ED Notes (Signed)
Pt seen by EDPA prior to RN assessment see PA notes, orders received for d/c and initiated.  Pt alert, NAD, calm, interactive, skin W&D, resps e/u, speaking in clear complete sentences. C/o L upper back toothache, whole side of face is hurting. Onset Thursday. Have been taking ibuprofen all day. (denies: nvd, fever, bleeding, drainage, swelling, dizziness or other sx.

## 2012-07-18 ENCOUNTER — Encounter: Payer: Self-pay | Admitting: Family Medicine

## 2012-07-18 ENCOUNTER — Ambulatory Visit (INDEPENDENT_AMBULATORY_CARE_PROVIDER_SITE_OTHER): Payer: 59 | Admitting: Family Medicine

## 2012-07-18 VITALS — BP 110/70 | HR 68 | Temp 97.8°F | Ht 69.75 in | Wt 164.0 lb

## 2012-07-18 DIAGNOSIS — M542 Cervicalgia: Secondary | ICD-10-CM

## 2012-07-18 DIAGNOSIS — R6884 Jaw pain: Secondary | ICD-10-CM

## 2012-07-18 DIAGNOSIS — F411 Generalized anxiety disorder: Secondary | ICD-10-CM | POA: Insufficient documentation

## 2012-07-18 MED ORDER — ALPRAZOLAM 0.25 MG PO TABS: 0.2500 mg | ORAL_TABLET | Freq: Three times a day (TID) | ORAL | Status: DC | PRN

## 2012-07-18 MED ORDER — CITALOPRAM HYDROBROMIDE 20 MG PO TABS: 20.0000 mg | ORAL_TABLET | Freq: Every day | ORAL | Status: DC

## 2012-07-18 NOTE — Patient Instructions (Signed)
Use alprazolam sparingly and with caution due to sedation.  Start with just 1/2 tablet. Call your employee assistance program to set up counseling. Start the citalopram at just 1/2 tablet for first week, and increase to full if tolerating without side effects.  Return in 6 weeks, sooner if needed. Remember to let us know if you develop high energy, lack of sleep, or other manic/hypomanic symptoms

## 2012-07-18 NOTE — Progress Notes (Signed)
Chief Complaint  Patient presents with  . Anxiety    new patient with anxiety. Also wants to ask you some questions re:her immune system. She feels like every couple weeks she is picking up a virus.    She has suffered from anxiety and depression, but has tried to avoid medications.  Many years ago she was put on Prozac, Seroquel (? If bipolar)--she recalls feeling like a zombie.  With her postpartum depression she was rx'd Celexa, but only took it for a week then stopped.  Denies any side effects, just didn't want to take meds.  Currently is finding that she clenches her jaw a lot, causing jaw pain, ear ringing.  She is irritable, "flips on her kids".  Has a 72 month old, just got a dog, just got married 2-3 weeks ago (partner of 4 years), stressful.  She wakes frequently through the night--related to new dog, neck pain, and baby sleeping with her.  She has a hard time falling asleep, hard to shut down mind.  Able to get back to sleep when woken up (by dog, baby, etc).  Has panic attacks, only about once a month--can't breathe, needs to go to a quiet room and breathe deeply/slowly.  No associated chest pain or tachycardia.  Panic attacks have been worse in the past, but controllable and only once a month since having the baby.  For stress reduction she walks dog, listens to music, goes to room by herself to escape others.  Denies depression, but admits to sometimes feeling helpless.  Since getting married she has been binge-eating more (was able to lose some weight for her wedding).  Neck pain--in a lot of pain. She has a bulging disc.  Planning to start PT soon.  She was just started on Naproxen about 5 days ago, which seems to be helping with the neck pain, and slightly with jaw pain.  In past took xanax--too sedating.  Took valium prior to MRI, seemed less sedating.  Doesn't recall dose of xanax she tried. When asked about manic or hypomanic behavior (since previously possibly diagnosed as bipolar), she  describes herself as "happy and in a good mood"--denies true symptoms of mania or hypomania.  She is also concerned that she has gotten the stomach bug (vomiting and diarrhea) frequently.  No other frequent illnesses (URI or any bacterial infections).   Past Medical History  Diagnosis Date  . Pregnancy induced hypertension     with both pregnancies  . Postpartum depression     with first pregnancy  . Neck pain     bulging C-3 (Dr. Maurice Small at [facility])    Past Surgical History  Procedure Laterality Date  . Tonsillectomy    . Wisdom tooth extraction    . Tubal ligation  10/19/2011    Procedure: POST PARTUM TUBAL LIGATION;  Surgeon: Dorien Chihuahua. Richardson Dopp, MD;  Location: [facility];  Service: Gynecology;  Laterality: Bilateral;    History   Social History  . Marital Status: Married    Spouse Name: N/A    Number of Children: 2  . 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 Smoking status: Former Smoker    Quit date: 10/13/2009  . Smokeless tobacco: Never Used  . Alcohol Use: Yes     Comment: 3-4 drinks per weekend.  . Drug Use: No  . Sexually Active: Yes -- Female partner(s)    Birth  Control/ Protection: Surgical   Other Topics Concern  . Not on file   Social History Narrative   Married, 2 daughters, 1 dog, 1 cat.    Family History  Problem Relation Age of Onset  . Anesthesia problems Neg Hx   . Diabetes Neg Hx   . Bipolar disorder Mother   . Heart disease Maternal Grandfather   . Hypertension Maternal Grandfather   . Cancer Maternal Grandfather   . Depression Sister   . Anxiety disorder Sister   . Anxiety disorder Brother     Current outpatient prescriptions:Multiple Vitamins-Minerals (MULTIVITAMIN WITH MINERALS) tablet, Take 1 tablet by mouth daily., Disp: , Rfl: ;  naproxen (NAPROSYN) 500 MG tablet, Take 500 mg by mouth 2 (two) times daily with a meal., Disp: , Rfl: ;  ALPRAZolam (XANAX) 0.25 MG  tablet, Take 1 tablet (0.25 mg total) by mouth 3 (three) times daily as needed for sleep or anxiety. Take 1/2 to 1 tablet as needed, Disp: 15 tablet, Rfl: 0 citalopram (CELEXA) 20 MG tablet, Take 1 tablet (20 mg total) by mouth daily. Start at 1/2 tablet once daily for the first week., Disp: 30 tablet, Rfl: 1  Allergies  Allergen Reactions  . Penicillins Hives   ROS:  Denies fevers, URI or allergy symptoms, chest pain, palpitations, shortness of breath, cough, nausea, vomiting, recent diarrhea, urinary complaints, skin rashes.  +neck pain, anxiety, jaw pain, headaches (tension).  Denies suicidal ideation, bleeding, bruising, rash or other concerns.  PHYSICAL EXAM: BP 110/70  Pulse 68  Temp(Src) 97.8 F (36.6 C) (Oral)  Ht 5' 9.75" (1.772 m)  Wt 164 lb (74.39 kg)  BMI 23.69 kg/m2  LMP 07/14/2012  Breastfeeding? No Pleasant, well developed female in no distress HEENT:  PERRL, conjunctiva and sclera clear Neck: no lymphadenopathy, thyromegaly or mass.  No c-spine tenderness.  +tenderness in paraspinous muscles and trapezius Heart: regular rate and rhythm without murmur Lungs: clear bilaterally Abdomen: soft, nontender Extremities: no edema Skin: no rash Neuro: alert and oriented, normal cranial nerves, gait Psych: normal mood, full range of affect.  Normal speech, eye contact, hygiene and grooming  ASSESSMENT/PLAN:  Anxiety state, unspecified - Plan: citalopram (CELEXA) 20 MG tablet, ALPRAZolam (XANAX) 0.25 MG tablet  Jaw pain - muscular, from clenching teeth related to anxiety  Neck pain - continue with PT as planned.  continue Naproxen.    Counseling through EAP, or call for recommendations of counselors. Start celexa--side effects reviewed in detail.  Start at 1/2 tab for one week then increase to full tablet if tolerated. Reviewed risks/side effects of xanax--start with just 1/2 of 0.25 mg given h/o sedation in past, especially if during day, and use with caution  Discussed  risks of mania/hypomania if truly underlying bipolar (doesn't sound like it)  F/u 6 weeks

## 2012-07-23 ENCOUNTER — Telehealth: Payer: Self-pay | Admitting: Family Medicine

## 2012-07-23 NOTE — Telephone Encounter (Signed)
Advise pt--we discussed that sometimes anxiety can be a little worse (and mind racing is a symptom of anxiety) when the medication is first started (which is why we start at 1/2 tablet).  This should improve with time. If taking medication at bedtime, change to mornings to see if that helps.  If already taking in the morning, continue.  Should get better over time, as medication gets in her system.  She may use the alprazolam if needed short-term

## 2012-07-23 NOTE — Telephone Encounter (Signed)
Patient advised.

## 2012-08-29 ENCOUNTER — Encounter: Payer: Self-pay | Admitting: Family Medicine

## 2012-08-29 ENCOUNTER — Ambulatory Visit (INDEPENDENT_AMBULATORY_CARE_PROVIDER_SITE_OTHER): Payer: 59 | Admitting: Family Medicine

## 2012-08-29 VITALS — BP 122/80 | HR 72 | Ht 69.75 in | Wt 161.0 lb

## 2012-08-29 DIAGNOSIS — F411 Generalized anxiety disorder: Secondary | ICD-10-CM

## 2012-08-29 MED ORDER — CITALOPRAM HYDROBROMIDE 40 MG PO TABS: 40.0000 mg | ORAL_TABLET | Freq: Every day | ORAL | Status: DC

## 2012-08-29 NOTE — Patient Instructions (Addendum)
Please call Thea Silversmith to see about counseling after hours. Increase the citalopram--you may want to increase slowly (1.5 tablets of 20mg --30mg  total dose) for a few days, before increasing to the 40mg  dose. Use the alprazolam if you need it, when feeling incredibly anxious, overwhelmed, or panicky. Ask others for help

## 2012-08-29 NOTE — Progress Notes (Signed)
Chief Complaint  Patient presents with  . Anxiety    6 week follow up.   Patient presents for follow up on anxiety.  Her active young daughter accompanies her today.   She was doing "really good"--saw dentist, had some teeth shaved down and a tooth pulled, all of which may have been effecting her jaw pain.  Jaw pain is improved.  She hasn't been able to get to PT for her neck (hasn't accrued enough hours to get the time off from work).  Neck pain is somewhat improved, only using the naprosyn as needed, noted some water weight gain with the med when taking it regularly.  When she started the medication (citalopram) she had some nausea.  It caused some nausea so she took at night, but that affected her sleep.  She switched back to daytime, no longer has nausea, and is sleeping okay now (up just because of the baby), falling asleep a little more easily.  She only took the alprazolam early on (around the time of her dental work, first week of meds).   Started feeling overwhelmed a week ago.  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  Hasn't been able to start counseling because hasn't been able to get the time off work.  Yesterday she "lost it"--got into argument with her best friend.  Yesterday her child was sick with fever 104, wasn't able to get off work, wanted to be with her child, upset she couldn't.  She went for a walk at work.  Feels overwhelmed, wishing she could do it all, but reached a breaking point.  LMP 4/29, so not hormonal/premenstrual  Past Medical History  Diagnosis Date  . Pregnancy induced hypertension     with both pregnancies  . Postpartum depression     with first pregnancy  . Neck pain     bulging C-3 (Dr. Maurice Small at [facility])   Past Surgical History  Procedure Laterality Date  . Tonsillectomy    . Wisdom tooth extraction    . Tubal ligation  10/19/2011    Procedure: POST PARTUM TUBAL LIGATION;  Surgeon: Dorien Chihuahua. Richardson Dopp,  MD;  Location: [facility];  Service: Gynecology;  Laterality: Bilateral;   History   Social History  . Marital Status: Married    Spouse Name: N/A    Number of Children: 2  . 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 Smoking status: Former Smoker    Quit date: 10/13/2009  . Smokeless tobacco: Never Used  . Alcohol Use: Yes     Comment: 3-4 drinks per weekend.  . Drug Use: No  . Sexually Active: Yes -- Female partner(s)    Birth Control/ Protection: Surgical   Other Topics Concern  . Not on file   Social History Narrative   Married, 2 daughters, 1 dog, 1 cat.   Current outpatient prescriptions:citalopram (CELEXA) 40 MG tablet, Take 1 tablet (40 mg total) by mouth daily., Disp: 90 tablet, Rfl: 0;  Multiple Vitamins-Minerals (MULTIVITAMIN WITH MINERALS) tablet, Take 1 tablet by mouth daily., Disp: , Rfl: ;  naproxen (NAPROSYN) 500 MG tablet, Take 500 mg by mouth 2 (two) times daily with a meal., Disp: , Rfl:  ALPRAZolam (XANAX) 0.25 MG tablet, Take 1 tablet (0.25 mg total) by mouth 3 (three) times daily as needed for sleep or anxiety. Take 1/2 to 1 tablet  as needed, Disp: 15 tablet, Rfl: 0 (was on 20 mg of citalopram prior to today's visit).  Allergies  Allergen Reactions  . Penicillins Hives   ROS:  Denies fevers, URI symptoms (just +sick family contacts).  Denies headaches, chest pain.  Neck and jaw pain improved.  No nausea, vomiting, abdominal pain; no suicidal ideation.  Sleep improved.  See HPI  PHYSICAL EXAM: BP 122/80  Pulse 72  Ht 5' 9.75" (1.772 m)  Wt 161 lb (73.029 kg)  BMI 23.26 kg/m2  LMP 08/14/2012  Breastfeeding? No Pleasant female, accompanied by active, talkative young child.  She interacted appropriately with child, but became very tearful in discussing how she has felt in the last week, especially yesterday. Normal eye contact, speech, hygiene and grooming.  Full range of affect.  Normal  speech  ASSESSMENT/PLAN: Anxiety state, unspecified - Plan: citalopram (CELEXA) 40 MG tablet component of depression as well  Increase citalopram to 40mg .  Requires 90 day rx from [facility] Continue alprazolam as needed (sparingly--reviewed stress reduction techniques) Given Abigail Butts name for counseling potentially after hours (although may cost more if not through her EAP, it would allow her to not miss work) Discussed importance of counseling, stress reduction, asking other's for help, not trying to do everything herself; team effort with her husband, support of friends and family  F/u 4-6 weeks, sooner prn  25 minute visit, all counseling

## 2012-09-13 ENCOUNTER — Ambulatory Visit (INDEPENDENT_AMBULATORY_CARE_PROVIDER_SITE_OTHER): Payer: 59 | Admitting: Family Medicine

## 2012-09-13 ENCOUNTER — Encounter: Payer: Self-pay | Admitting: Family Medicine

## 2012-09-13 VITALS — BP 120/72 | HR 80 | Temp 98.1°F | Ht 69.25 in | Wt 158.0 lb

## 2012-09-13 DIAGNOSIS — F411 Generalized anxiety disorder: Secondary | ICD-10-CM

## 2012-09-13 MED ORDER — ALPRAZOLAM 0.25 MG PO TABS: 0.2500 mg | ORAL_TABLET | Freq: Three times a day (TID) | ORAL | Status: DC | PRN

## 2012-09-13 NOTE — Patient Instructions (Addendum)
  You need to call therapist to set up counseling.  If she feels that you need to be out of work, while adjusting meds and seeing her frequently, have her call me to discuss.  I recommended Thea Silversmith due to her having extended hours that might not interfere with your work schedule as much, but I'm willing to work with any therapist you prefer to see.  You need to keep the xanax with you, to take when you start to feel a panic attack coming on.  If you had your medication with you yesterday, it probably wouldn't have escalated to where the paramedics were called.  Your poor sleep is likely contributing to your difficulties coping with Nurse, children's.    Continue 40mg  citalopram.  Use the xanax as needed for panic attacks (during day, and may use at night if having trouble getting back to sleep due to anxiety, nightmare).  The use of xanax is expected to be short-term, as anxiety should hopefully lessen as the citalopram 40mg  gets into your system.  This increase in anxiety should be temporary with the recent dose increase, and improve shortly.  I recommend talking to your HR person at work, and likely would benefit from [facility].  Bring forms to your next appointment.

## 2012-09-13 NOTE — Progress Notes (Signed)
Chief Complaint  Patient presents with  . panic attack    panic attacks for a couple of weeks but had a really severe attack yesterday   Dose was increased at last visit 2 weeks ago.  She increased to 30mg  initially, then went up to 40mg  when rx arrived in the mail last week.  Denies nausea, headache or other side effects (other than increased anxiety).  Having stress at work, related to her manager "acting very childish". She started crying at work yesterday, chest felt like it was caving in, hands/feet started tingling, she couldn't breathe, felt dizzy.  They called the paramedics, and it took about 45 minutes for her to calm down.  She didn't have xanax with her to take (she reports still having a couple of pills left).    She's been depressed, thoughts of suicide (no plan), feeling overwhelmed.  She states she has been too busy to call about counseling. Not sleeping well--waking up with nightmares, waking up crying, just over the last week since starting the higher dose of citalopram  Asking for 2 week leave to get short-term disability. She was told by her boss to consider this.  Past Medical History  Diagnosis Date  . Pregnancy induced hypertension     with both pregnancies  . Postpartum depression     with first pregnancy  . Neck pain     bulging C-3 (Dr. Maurice Small at [facility])   History   Social History  . Marital Status: Married    Spouse Name: N/A    Number of Children: 2  . 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 Smoking status: Former Smoker    Quit date: 10/13/2009  . Smokeless tobacco: Never Used  . Alcohol Use: Yes     Comment: 3-4 drinks per weekend.  . Drug Use: No  . Sexually Active: Yes -- Female partner(s)    Birth Control/ Protection: Surgical   Other Topics Concern  . Not on file   Social History Narrative   Married, 2 daughters, 1 dog, 1 cat.    Current Outpatient  Prescriptions on File Prior to Visit  Medication Sig Dispense Refill  . ALPRAZolam (XANAX) 0.25 MG tablet Take 1 tablet (0.25 mg total) by mouth 3 (three) times daily as needed for sleep or anxiety. Take 1/2 to 1 tablet as needed  15 tablet  0  . citalopram (CELEXA) 40 MG tablet Take 1 tablet (40 mg total) by mouth daily.  90 tablet  0  . Multiple Vitamins-Minerals (MULTIVITAMIN WITH MINERALS) tablet Take 1 tablet by mouth daily.      . naproxen (NAPROSYN) 500 MG tablet Take 500 mg by mouth 2 (two) times daily with a meal.       No current facility-administered medications on file prior to visit.   Allergies  Allergen Reactions  . Penicillins Hives   ROS:  Denies headaches, nausea, vomiting, diarrhea, URI symptoms.  Does mention a rash that she has had x 6 months, which didn't improve with antifungals.  See HPI  PHYSICAL EXAM: BP 120/72  Pulse 80  Temp(Src) 98.1 F (36.7 C) (Oral)  Ht 5' 9.25" (1.759 m)  Wt 158 lb (71.668 kg)  BMI 23.16 kg/m2  LMP 08/14/2012 Well appearing female, in no distress. Daughter not present today. Normal eye contact, speech.  Not crying.  Normal hygiene and grooming  ASSESSMENT/PLAN: Anxiety state, unspecified -  Plan: ALPRAZolam (XANAX) 0.25 MG tablet  Worsening anxiety with panic attack--severe attack, with xanax unavailable.  Likely increased anxiety related to SSRI dose increase.  Discussed importance of counseling, and temporarily using alprazolam for the increased anxiety--to have with her at work to prevent another prolonged attack.  Counseled re: stress reduction, removing herself from situation, deep breathing.  Discussed importance of counseling, especially in setting of some suicidal thoughts, while waiting for higher doses of meds to be effective.  Advised to call TODAY.  We discussed that likely 2 weeks off (especially if counseling NOT yet arranged) wouldn't necessarily benefit her.  She likely wouldn't have had such reaction (for her boss to even  suggest it) if she had xanax with her.  If ongoing anxiety despite higher dose, may need med adjustments.  She is willing to see psych if not improving with this regimen (there are certain meds she won't take, worried about stigma, family h/o bipolar, etc; longterm psych issues, probably a good candidate for psych).  Pt info-- You need to call therapist to set up counseling.  If she feels that you need to be out of work, while adjusting meds and seeing her frequently, have her call me to discuss.  I recommended Thea Silversmith due to her having extended hours that might not interfere with your work schedule as much, but I'm willing to work with any therapist you prefer to see.  You need to keep the xanax with you, to take when you start to feel a panic attack coming on.  If you had your medication with you yesterday, it probably wouldn't have escalated to where the paramedics were called.  Your poor sleep is likely contributing to your difficulties coping with Nurse, children's.    Continue 40mg  citalopram.  Use the xanax as needed for panic attacks (during day, and may use at night if having trouble getting back to sleep due to anxiety, nightmare).  The use of xanax is expected to be short-term, as anxiety should hopefully lessen as the citalopram 40mg  gets into your system.  This increase in anxiety should be temporary with the recent dose increase, and improve shortly.  I recommend talking to your HR person at work, and likely would benefit from [facility].  Bring forms to your next appointment.  Rash was not addressed at this acute visit (and ongoing x months)--advised to make separate appt or address at f/u.

## 2012-09-14 ENCOUNTER — Encounter: Payer: Self-pay | Admitting: Family Medicine

## 2012-09-16 ENCOUNTER — Other Ambulatory Visit: Payer: Self-pay | Admitting: Family Medicine

## 2012-09-17 NOTE — Telephone Encounter (Signed)
Left message for patient to return my call to see if this rx refill is actually needed as patient is supposed to be taking 40mg  and the refill is requesting 20mg . Rx was sent for the 40mg  to mail order pharmacy for 90 day supply 08/29/12.

## 2012-10-04 ENCOUNTER — Ambulatory Visit: Payer: 59 | Admitting: Family Medicine

## 2012-12-19 ENCOUNTER — Telehealth: Payer: Self-pay | Admitting: Family Medicine

## 2012-12-19 DIAGNOSIS — F411 Generalized anxiety disorder: Secondary | ICD-10-CM

## 2012-12-19 MED ORDER — ALPRAZOLAM 0.25 MG PO TABS: 0.2500 mg | ORAL_TABLET | Freq: Three times a day (TID) | ORAL | Status: DC | PRN

## 2012-12-19 NOTE — Telephone Encounter (Signed)
Phoned in rx and left message for patient with Dr.Knapp's message.

## 2012-12-19 NOTE — Telephone Encounter (Signed)
Last filled 5/29.  She no-showed her appointment in June, and doesn't have a f/u scheduled.  Okay for refill #30 once only.  Ensure that she is getting counseling for her anxiety.  She will need f/u OV if increased anxiety persists, needing frequent xanax, and prior to any additional refills of xanax

## 2013-01-31 ENCOUNTER — Ambulatory Visit: Payer: 59 | Admitting: Family Medicine

## 2013-01-31 ENCOUNTER — Telehealth: Payer: Self-pay | Admitting: Family Medicine

## 2013-01-31 DIAGNOSIS — F411 Generalized anxiety disorder: Secondary | ICD-10-CM

## 2013-01-31 MED ORDER — CITALOPRAM HYDROBROMIDE 40 MG PO TABS: 40.0000 mg | ORAL_TABLET | Freq: Every day | ORAL | Status: DC

## 2013-01-31 NOTE — Telephone Encounter (Signed)
Citalopram refilled. But xanax will not be refilled without appointment (I know she isn't requesting now, but it will not be refilled if/when she does ask if she hasn't been seen in f/u since May)

## 2013-01-31 NOTE — Telephone Encounter (Signed)
Called pt. Shannon [name] that about meds as instructed by Dr Lynelle Doctor

## 2013-10-14 ENCOUNTER — Other Ambulatory Visit: Payer: Self-pay | Admitting: Physician Assistant

## 2013-10-16 DIAGNOSIS — M19011 Primary osteoarthritis, right shoulder: Secondary | ICD-10-CM

## 2013-10-16 DIAGNOSIS — M67919 Unspecified disorder of synovium and tendon, unspecified shoulder: Secondary | ICD-10-CM

## 2013-10-16 DIAGNOSIS — M24111 Other articular cartilage disorders, right shoulder: Secondary | ICD-10-CM

## 2013-10-16 DIAGNOSIS — M719 Bursopathy, unspecified: Secondary | ICD-10-CM

## 2013-10-16 HISTORY — DX: Bursopathy, unspecified: M71.9

## 2013-10-16 HISTORY — DX: Unspecified disorder of synovium and tendon, unspecified shoulder: M67.919

## 2013-10-16 HISTORY — DX: Primary osteoarthritis, right shoulder: M19.011

## 2013-10-16 HISTORY — DX: Other articular cartilage disorders, right shoulder: M24.111

## 2013-10-22 ENCOUNTER — Encounter (HOSPITAL_BASED_OUTPATIENT_CLINIC_OR_DEPARTMENT_OTHER): Payer: Self-pay | Admitting: *Deleted

## 2013-10-22 DIAGNOSIS — J069 Acute upper respiratory infection, unspecified: Secondary | ICD-10-CM

## 2013-10-22 HISTORY — DX: Acute upper respiratory infection, unspecified: J06.9
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
## 2013-10-24 ENCOUNTER — Encounter (HOSPITAL_BASED_OUTPATIENT_CLINIC_OR_DEPARTMENT_OTHER): Payer: Self-pay | Admitting: *Deleted

## 2013-10-24 ENCOUNTER — Ambulatory Visit (HOSPITAL_BASED_OUTPATIENT_CLINIC_OR_DEPARTMENT_OTHER)
Admission: RE | Admit: 2013-10-24 | Discharge: 2013-10-24 | Disposition: A | Discharge status: Home / Self Care | Payer: 59 | Source: Ambulatory Visit | Attending: Orthopedic Surgery | Admitting: Orthopedic Surgery

## 2013-10-24 ENCOUNTER — Encounter (HOSPITAL_BASED_OUTPATIENT_CLINIC_OR_DEPARTMENT_OTHER)
Admission: RE | Disposition: A | Discharge status: Home / Self Care | Payer: Self-pay | Source: Ambulatory Visit | Attending: Orthopedic Surgery

## 2013-10-24 ENCOUNTER — Ambulatory Visit (HOSPITAL_BASED_OUTPATIENT_CLINIC_OR_DEPARTMENT_OTHER): Payer: 59 | Admitting: Anesthesiology

## 2013-10-24 ENCOUNTER — Encounter (HOSPITAL_BASED_OUTPATIENT_CLINIC_OR_DEPARTMENT_OTHER): Payer: 59 | Admitting: Anesthesiology

## 2013-10-24 DIAGNOSIS — M719 Bursopathy, unspecified: Secondary | ICD-10-CM | POA: Insufficient documentation

## 2013-10-24 DIAGNOSIS — M25819 Other specified joint disorders, unspecified shoulder: Secondary | ICD-10-CM | POA: Insufficient documentation

## 2013-10-24 DIAGNOSIS — M948X9 Other specified disorders of cartilage, unspecified sites: Secondary | ICD-10-CM | POA: Insufficient documentation

## 2013-10-24 DIAGNOSIS — M7580 Other shoulder lesions, unspecified shoulder: Principal | ICD-10-CM

## 2013-10-24 DIAGNOSIS — Z87891 Personal history of nicotine dependence: Secondary | ICD-10-CM | POA: Insufficient documentation

## 2013-10-24 DIAGNOSIS — M67919 Unspecified disorder of synovium and tendon, unspecified shoulder: Secondary | ICD-10-CM | POA: Insufficient documentation

## 2013-10-24 HISTORY — DX: Primary osteoarthritis, right shoulder: M19.011

## 2013-10-24 HISTORY — DX: Unspecified disorder of synovium and tendon, unspecified shoulder: M67.919

## 2013-10-24 HISTORY — PX: SHOULDER ARTHROSCOPY WITH DISTAL CLAVICLE RESECTION: SHX5675

## 2013-10-24 HISTORY — DX: Other specified symptoms and signs involving the digestive system and abdomen: R19.8

## 2013-10-24 HISTORY — DX: Other articular cartilage disorders, right shoulder: M24.111

## 2013-10-24 HISTORY — DX: Bursopathy, unspecified: M71.9

## 2013-10-24 HISTORY — DX: Acute upper respiratory infection, unspecified: J06.9

## 2013-10-24 LAB — POCT HEMOGLOBIN-HEMACUE: Hemoglobin: 13.1 g/dL (ref 12.0–15.0)

## 2013-10-24 SURGERY — SHOULDER ARTHROSCOPY WITH DISTAL CLAVICLE RESECTION
Anesthesia: Regional | Site: Shoulder | Laterality: Right

## 2013-10-24 MED ORDER — DEXAMETHASONE SODIUM PHOSPHATE 4 MG/ML IJ SOLN
INTRAMUSCULAR | Status: DC | PRN
  Administered 2013-10-24: 10 mg via INTRAVENOUS

## 2013-10-24 MED ORDER — PROPOFOL 10 MG/ML IV BOLUS
INTRAVENOUS | Status: DC | PRN
  Administered 2013-10-24: 150 mg via INTRAVENOUS

## 2013-10-24 MED ORDER — OXYCODONE HCL 5 MG/5ML PO SOLN: 5.0000 mg | Freq: Once | ORAL | Status: DC | PRN

## 2013-10-24 MED ORDER — FENTANYL CITRATE 0.05 MG/ML IJ SOLN
INTRAMUSCULAR | Status: DC | PRN
  Administered 2013-10-24: 50 ug via INTRAVENOUS

## 2013-10-24 MED ORDER — MIDAZOLAM HCL 2 MG/ML PO SYRP
12.0000 mg | ORAL_SOLUTION | Freq: Once | ORAL | Status: DC | PRN
Start: 2013-10-24 — End: 2013-10-24

## 2013-10-24 MED ORDER — SUCCINYLCHOLINE CHLORIDE 20 MG/ML IJ SOLN
INTRAMUSCULAR | Status: DC | PRN
  Administered 2013-10-24: 100 mg via INTRAVENOUS

## 2013-10-24 MED ORDER — PROMETHAZINE HCL 25 MG/ML IJ SOLN
6.2500 mg | Freq: Once | INTRAMUSCULAR | Status: AC
  Administered 2013-10-24: 6.25 mg via INTRAVENOUS

## 2013-10-24 MED ORDER — MIDAZOLAM HCL 2 MG/2ML IJ SOLN
INTRAMUSCULAR | Status: AC
  Filled 2013-10-24: qty 2

## 2013-10-24 MED ORDER — PROMETHAZINE HCL 25 MG/ML IJ SOLN
INTRAMUSCULAR | Status: AC
  Filled 2013-10-24: qty 1

## 2013-10-24 MED ORDER — ONDANSETRON HCL 4 MG/2ML IJ SOLN
INTRAMUSCULAR | Status: DC | PRN
  Administered 2013-10-24: 4 mg via INTRAVENOUS

## 2013-10-24 MED ORDER — FENTANYL CITRATE 0.05 MG/ML IJ SOLN
50.0000 ug | INTRAMUSCULAR | Status: DC | PRN
  Administered 2013-10-24: 100 ug via INTRAVENOUS

## 2013-10-24 MED ORDER — FENTANYL CITRATE 0.05 MG/ML IJ SOLN
INTRAMUSCULAR | Status: AC
  Filled 2013-10-24: qty 2

## 2013-10-24 MED ORDER — LIDOCAINE HCL (CARDIAC) 20 MG/ML IV SOLN
INTRAVENOUS | Status: DC | PRN
  Administered 2013-10-24: 50 mg via INTRAVENOUS

## 2013-10-24 MED ORDER — BUPIVACAINE-EPINEPHRINE (PF) 0.5% -1:200000 IJ SOLN
INTRAMUSCULAR | Status: DC | PRN
  Administered 2013-10-24: 30 mL via PERINEURAL

## 2013-10-24 MED ORDER — CLINDAMYCIN PHOSPHATE 900 MG/50ML IV SOLN
900.0000 mg | INTRAVENOUS | Status: AC
  Administered 2013-10-24: 900 mg via INTRAVENOUS

## 2013-10-24 MED ORDER — HYDROMORPHONE HCL PF 1 MG/ML IJ SOLN
INTRAMUSCULAR | Status: AC
  Filled 2013-10-24: qty 1

## 2013-10-24 MED ORDER — SODIUM CHLORIDE 0.9 % IJ SOLN
INTRAMUSCULAR | Status: AC
  Filled 2013-10-24: qty 10

## 2013-10-24 MED ORDER — OXYCODONE HCL 5 MG PO TABS: 5.0000 mg | ORAL_TABLET | Freq: Once | ORAL | Status: DC | PRN

## 2013-10-24 MED ORDER — LACTATED RINGERS IV SOLN
INTRAVENOUS | Status: DC
  Administered 2013-10-24: 07:00:00 via INTRAVENOUS

## 2013-10-24 MED ORDER — MIDAZOLAM HCL 2 MG/2ML IJ SOLN
1.0000 mg | INTRAMUSCULAR | Status: DC | PRN
  Administered 2013-10-24: 2 mg via INTRAVENOUS

## 2013-10-24 MED ORDER — ONDANSETRON HCL 4 MG PO TABS: 4.0000 mg | ORAL_TABLET | Freq: Three times a day (TID) | ORAL | Status: DC | PRN

## 2013-10-24 MED ORDER — LACTATED RINGERS IV SOLN: INTRAVENOUS | Status: DC

## 2013-10-24 MED ORDER — BUPIVACAINE HCL (PF) 0.25 % IJ SOLN
INTRAMUSCULAR | Status: AC
  Filled 2013-10-24: qty 120

## 2013-10-24 MED ORDER — CLINDAMYCIN PHOSPHATE 900 MG/50ML IV SOLN
INTRAVENOUS | Status: AC
  Filled 2013-10-24: qty 50

## 2013-10-24 MED ORDER — HYDROMORPHONE HCL PF 1 MG/ML IJ SOLN
0.2500 mg | INTRAMUSCULAR | Status: DC | PRN
Start: 2013-10-24 — End: 2013-10-24
  Administered 2013-10-24 (×3): 0.5 mg via INTRAVENOUS

## 2013-10-24 MED ORDER — HYDROMORPHONE HCL 2 MG PO TABS
2.0000 mg | ORAL_TABLET | ORAL | Status: DC | PRN
Start: 2013-10-24 — End: 2014-05-05

## 2013-10-24 MED ORDER — CHLORHEXIDINE GLUCONATE 4 % EX LIQD: 60.0000 mL | Freq: Once | CUTANEOUS | Status: DC

## 2013-10-24 SURGICAL SUPPLY — 72 items
BENZOIN TINCTURE PRP APPL 2/3 (GAUZE/BANDAGES/DRESSINGS) IMPLANT
BLADE CUTTER GATOR 3.5 (BLADE) ×3 IMPLANT
BLADE CUTTER MENIS 5.5 (BLADE) IMPLANT
BLADE GREAT WHITE 4.2 (BLADE) ×2 IMPLANT
BLADE GREAT WHITE 4.2MM (BLADE) ×1
BLADE SURG 15 STRL LF DISP TIS (BLADE) IMPLANT
BLADE SURG 15 STRL SS (BLADE)
BUR OVAL 6.0 (BURR) ×3 IMPLANT
CANISTER SUCT 3000ML (MISCELLANEOUS) IMPLANT
CANNULA DRY DOC 8X75 (CANNULA) IMPLANT
CANNULA TWIST IN 8.25X7CM (CANNULA) IMPLANT
CLOSURE WOUND 1/2 X4 (GAUZE/BANDAGES/DRESSINGS)
DECANTER SPIKE VIAL GLASS SM (MISCELLANEOUS) IMPLANT
DRAPE OEC MINIVIEW 54X84 (DRAPES) IMPLANT
DRAPE STERI 35X30 U-POUCH (DRAPES) ×3 IMPLANT
DRAPE U-SHAPE 47X51 STRL (DRAPES) ×3 IMPLANT
DRAPE U-SHAPE 76X120 STRL (DRAPES) ×6 IMPLANT
DRSG PAD ABDOMINAL 8X10 ST (GAUZE/BANDAGES/DRESSINGS) ×3 IMPLANT
DURAPREP 26ML APPLICATOR (WOUND CARE) ×3 IMPLANT
ELECT MENISCUS 165MM 90D (ELECTRODE) ×3 IMPLANT
ELECT NEEDLE TIP 2.8 STRL (NEEDLE) IMPLANT
ELECT REM PT RETURN 9FT ADLT (ELECTROSURGICAL) ×3
ELECTRODE REM PT RTRN 9FT ADLT (ELECTROSURGICAL) ×1 IMPLANT
GAUZE SPONGE 4X4 12PLY STRL (GAUZE/BANDAGES/DRESSINGS) ×6 IMPLANT
GAUZE XEROFORM 1X8 LF (GAUZE/BANDAGES/DRESSINGS) ×3 IMPLANT
GLOVE BIO SURGEON STRL SZ 6.5 (GLOVE) ×4 IMPLANT
GLOVE BIO SURGEONS STRL SZ 6.5 (GLOVE) ×2
GLOVE BIOGEL PI IND STRL 7.0 (GLOVE) ×1 IMPLANT
GLOVE BIOGEL PI INDICATOR 7.0 (GLOVE) ×2
GLOVE ECLIPSE 6.5 STRL STRAW (GLOVE) ×6 IMPLANT
GLOVE ORTHO TXT STRL SZ7.5 (GLOVE) ×3 IMPLANT
GOWN STRL REUS W/ TWL LRG LVL3 (GOWN DISPOSABLE) ×3 IMPLANT
GOWN STRL REUS W/ TWL XL LVL3 (GOWN DISPOSABLE) ×1 IMPLANT
GOWN STRL REUS W/TWL LRG LVL3 (GOWN DISPOSABLE) ×6
GOWN STRL REUS W/TWL XL LVL3 (GOWN DISPOSABLE) ×2
IV NS IRRIG 3000ML ARTHROMATIC (IV SOLUTION) ×12 IMPLANT
MANIFOLD NEPTUNE II (INSTRUMENTS) ×3 IMPLANT
NDL SUT 6 .5 CRC .975X.05 MAYO (NEEDLE) IMPLANT
NEEDLE MAYO TAPER (NEEDLE)
NEEDLE SCORPION MULTI FIRE (NEEDLE) IMPLANT
NS IRRIG 1000ML POUR BTL (IV SOLUTION) IMPLANT
PACK ARTHROSCOPY DSU (CUSTOM PROCEDURE TRAY) ×3 IMPLANT
PACK BASIN DAY SURGERY FS (CUSTOM PROCEDURE TRAY) ×3 IMPLANT
PASSER SUT SWANSON 36MM LOOP (INSTRUMENTS) IMPLANT
PENCIL BUTTON HOLSTER BLD 10FT (ELECTRODE) ×3 IMPLANT
SET ARTHROSCOPY TUBING (MISCELLANEOUS) ×2
SET ARTHROSCOPY TUBING LN (MISCELLANEOUS) ×1 IMPLANT
SLEEVE SCD COMPRESS KNEE MED (MISCELLANEOUS) ×3 IMPLANT
SLING ARM IMMOBILIZER LRG (SOFTGOODS) IMPLANT
SLING ARM IMMOBILIZER MED (SOFTGOODS) IMPLANT
SLING ARM LRG ADULT FOAM STRAP (SOFTGOODS) ×3 IMPLANT
SLING ARM MED ADULT FOAM STRAP (SOFTGOODS) IMPLANT
SLING ARM XL FOAM STRAP (SOFTGOODS) IMPLANT
SPONGE LAP 4X18 X RAY DECT (DISPOSABLE) IMPLANT
STRIP CLOSURE SKIN 1/2X4 (GAUZE/BANDAGES/DRESSINGS) IMPLANT
SUCTION FRAZIER TIP 10 FR DISP (SUCTIONS) IMPLANT
SUT ETHIBOND 2 OS 4 DA (SUTURE) IMPLANT
SUT ETHILON 2 0 FS 18 (SUTURE) IMPLANT
SUT ETHILON 3 0 PS 1 (SUTURE) ×3 IMPLANT
SUT FIBERWIRE #2 38 T-5 BLUE (SUTURE)
SUT RETRIEVER MED (INSTRUMENTS) IMPLANT
SUT TIGER TAPE 7 IN WHITE (SUTURE) IMPLANT
SUT VIC AB 0 CT1 27 (SUTURE)
SUT VIC AB 0 CT1 27XBRD ANBCTR (SUTURE) IMPLANT
SUT VIC AB 2-0 SH 27 (SUTURE)
SUT VIC AB 2-0 SH 27XBRD (SUTURE) IMPLANT
SUT VIC AB 3-0 FS2 27 (SUTURE) IMPLANT
SUTURE FIBERWR #2 38 T-5 BLUE (SUTURE) IMPLANT
TAPE FIBER 2MM 7IN #2 BLUE (SUTURE) IMPLANT
TOWEL OR 17X24 6PK STRL BLUE (TOWEL DISPOSABLE) ×3 IMPLANT
WATER STERILE IRR 1000ML POUR (IV SOLUTION) ×3 IMPLANT
YANKAUER SUCT BULB TIP NO VENT (SUCTIONS) IMPLANT

## 2013-10-24 NOTE — Anesthesia Procedure Notes (Addendum)
Anesthesia Regional Block:  Interscalene brachial plexus block  Pre-Anesthetic Checklist: ,, timeout performed, Correct Patient, Correct Site, Correct Laterality, Correct Procedure, Correct Position, site marked, Risks and benefits discussed, pre-op evaluation,  At surgeon's request and post-op pain management  Laterality: Right  Prep: Maximum Sterile Barrier Precautions used and chloraprep       Needles:  Injection technique: Single-shot  Needle Type: Echogenic Stimulator Needle     Needle Length: 5cm 5 cm Needle Gauge: 22 and 22 G    Additional Needles:  Procedures: ultrasound guided (picture in chart) and nerve stimulator Interscalene brachial plexus block  Nerve Stimulator or Paresthesia:  Response: Biceps response,   Additional Responses:   Narrative:  Start time: 10/24/2013 6:55 AM End time: 10/24/2013 7:04 AM Injection made incrementally with aspirations every 5 mL. Anesthesiologist: Sampson GoonFitzgerald, MD  Additional Notes: 2% Lidocaine skin wheel.    Procedure Name: Intubation Performed by: York GricePEARSON, Jelisa  W Pre-anesthesia Checklist: Patient identified, Timeout performed, Emergency Drugs available, Suction available and Patient being monitored Patient Re-evaluated:Patient Re-evaluated prior to inductionOxygen Delivery Method: Circle system utilized Intubation Type: IV induction Ventilation: Mask ventilation without difficulty Laryngoscope Size: Miller and 2 Grade View: Grade I Tube type: Oral Number of attempts: 1 Airway Equipment and Method: Stylet Secured at: 21 cm Tube secured with: Tape Dental Injury: Teeth and Oropharynx as per pre-operative assessment

## 2013-10-24 NOTE — Interval H&P Note (Signed)
History and Physical Interval Note:  10/24/2013 7:38 AM  Shannon [name]  has presented today for surgery, with the diagnosis of RIGHT SHOULDER DISORDER ARTICULAR, DEGENERATIVE ARTHRITIS DISORDERS OF BURSAE  AND TENDONS IN SHOULDER REGION   The various methods of treatment have been discussed with the patient and family. After consideration of risks, benefits and other options for treatment, the patient has consented to  Procedure(s): RIGHT SHOULDER DEBRIDEMENT EXTENSIVE, DISTAL CLAVICULECTOMY, DECOMPRESSION SUBACROMIAL PARTIAL ACROMIOPLASTY WITH CORACOACROMIAL RELEASE  (Right) as a surgical intervention .  The patient's history has been reviewed, patient examined, no change in status, stable for surgery.  I have reviewed the patient's chart and labs.  Questions were answered to the patient's satisfaction.     Shannon [name] F

## 2013-10-24 NOTE — Anesthesia Postprocedure Evaluation (Signed)
  Anesthesia Post-op Note  Patient: Smiley HousemanKimberley Foye  Procedure(s) Performed: Procedure(s): RIGHT SHOULDER DEBRIDEMENT EXTENSIVE, DISTAL CLAVICULECTOMY, DECOMPRESSION SUBACROMIAL PARTIAL ACROMIOPLASTY WITH DEBRIDEMENT OF LABRIUM (Right)  Patient Location: PACU  Anesthesia Type:General and block  Level of Consciousness: awake and alert   Airway and Oxygen Therapy: Patient Spontanous Breathing  Post-op Pain: mild  Post-op Assessment: Post-op Vital signs reviewed, Patient's Cardiovascular Status Stable and Respiratory Function Stable  Post-op Vital Signs: Reviewed  Filed Vitals:   10/24/13 0925  BP:   Pulse: 82  Temp:   Resp: 14    Complications: No apparent anesthesia complications

## 2013-10-24 NOTE — Transfer of Care (Signed)
Immediate Anesthesia Transfer of Care Note  Patient: Shannon [name]  Procedure(s) Performed: Procedure(s): RIGHT SHOULDER DEBRIDEMENT EXTENSIVE, DISTAL CLAVICULECTOMY, DECOMPRESSION SUBACROMIAL PARTIAL ACROMIOPLASTY WITH DEBRIDEMENT OF LABRIUM (Right)  Patient Location: PACU  Anesthesia Type:GA combined with regional for post-op pain  Level of Consciousness: sedated and patient cooperative  Airway & Oxygen Therapy: Patient Spontanous Breathing and Patient connected to face mask oxygen  Post-op Assessment: Report given to PACU RN and Post -op Vital signs reviewed and stable  Post vital signs: Reviewed and stable  Complications: No apparent anesthesia complications

## 2013-10-24 NOTE — Discharge Instructions (Signed)
Shouder arthroscopy, partial rotator cuff tear debridement subacromial decompression Care After Instructions Refer to this sheet in the next few weeks. These discharge instructions provide you with general information on caring for yourself after you leave the hospital. Your caregiver may also give you specific instructions. Your treatment has been planned according to the most current medical practices available, but unavoidable complications sometimes occur. If you have any problems or questions after discharge, please call your caregiver. HOME INSTRUCTIONS  Wear sling for the next two days.  Remove bandages in 3 days and replace with band-aids.  May shower in 3 days but do not soak incisions.  May apply ice for up to 20 minutes at a time for pain and swelling.  Follow up appointment in one week. Eat a well-balanced diet.  Avoid lifting or driving until you are instructed otherwise.  Make an appointment to see your caregiver for stitches (suture) or staple removal as directed.   SEEK MEDICAL CARE IF: You have swelling of your calf or leg.  You develop shortness of breath or chest pain.  You have redness, swelling, or increasing pain in the wound.  There is pus or any unusual drainage coming from the surgical site.  You notice a bad smell coming from the surgical site or dressing.  The surgical site breaks open after sutures or staples have been removed.  There is persistent bleeding from the suture or staple line.  You are getting worse or are not improving.  You have any other questions or concerns.  SEEK IMMEDIATE MEDICAL CARE IF:  You have a fever greater than 101 You develop a rash.  You have difficulty breathing.  You develop any reaction or side effects to medicines given.  Your knee motion is decreasing rather than improving.  MAKE SURE YOU:  Understand these instructions.  Will watch your condition.  Will get help right away if you are not doing well or get worse.    Post  Anesthesia Home Care Instructions  Activity: Get plenty of rest for the remainder of the day. A responsible adult should stay with you for 24 hours following the procedure.  For the next 24 hours, DO NOT: -Drive a car -Advertising copywriter -Drink alcoholic beverages -Take any medication unless instructed by your physician -Make any legal decisions or sign important papers.  Meals: Start with liquid foods such as gelatin or soup. Progress to regular foods as tolerated. Avoid greasy, spicy, heavy foods. If nausea and/or vomiting occur, drink only clear liquids until the nausea and/or vomiting subsides. Call your physician if vomiting continues.  Special Instructions/Symptoms: Your throat may feel dry or sore from the anesthesia or the breathing tube placed in your throat during surgery. If this causes discomfort, gargle with warm salt water. The discomfort should disappear within 24 hours.   Regional Anesthesia Blocks  1. Numbness or the inability to move the "blocked" extremity may last from 3-48 hours after placement. The length of time depends on the medication injected and your individual response to the medication. If the numbness is not going away after 48 hours, call your surgeon.  2. The extremity that is blocked will need to be protected until the numbness is gone and the  Strength has returned. Because you cannot feel it, you will need to take extra care to avoid injury. Because it may be weak, you may have difficulty moving it or using it. You may not know what position it is in without looking at it while the  block is in effect.  3. For blocks in the legs and feet, returning to weight bearing and walking needs to be done carefully. You will need to wait until the numbness is entirely gone and the strength has returned. You should be able to move your leg and foot normally before you try and bear weight or walk. You will need someone to be with you when you first try to ensure you do  not fall and possibly risk injury.  4. Bruising and tenderness at the needle site are common side effects and will resolve in a few days.  5. Persistent numbness or new problems with movement should be communicated to the surgeon or the [facility] [phone])/ [facility] [phone]).

## 2013-10-24 NOTE — Anesthesia Preprocedure Evaluation (Signed)
Anesthesia Evaluation  Patient identified by MRN, date of birth, ID band Patient awake    Reviewed: Allergy & Precautions, H&P , NPO status , Patient's Chart, lab work & pertinent test results  Airway Mallampati: II TM Distance: >3 FB Neck ROM: Full    Dental no notable dental hx. (+) Teeth Intact, Dental Advisory Given   Pulmonary neg pulmonary ROS, former smoker,  breath sounds clear to auscultation  Pulmonary exam normal       Cardiovascular negative cardio ROS  Rhythm:Regular Rate:Normal     Neuro/Psych negative neurological ROS  negative psych ROS   GI/Hepatic negative GI ROS, Neg liver ROS,   Endo/Other  negative endocrine ROS  Renal/GU negative Renal ROS  negative genitourinary   Musculoskeletal   Abdominal   Peds  Hematology negative hematology ROS (+)   Anesthesia Other Findings   Reproductive/Obstetrics negative OB ROS                           Anesthesia Physical Anesthesia Plan  ASA: I  Anesthesia Plan: General and Regional   Post-op Pain Management:    Induction: Intravenous  Airway Management Planned: Oral ETT  Additional Equipment:   Intra-op Plan:   Post-operative Plan: Extubation in OR  Informed Consent: I have reviewed the patients History and Physical, chart, labs and discussed the procedure including the risks, benefits and alternatives for the proposed anesthesia with the patient or authorized representative who has indicated his/her understanding and acceptance.   Dental advisory given  Plan Discussed with: CRNA  Anesthesia Plan Comments:         Anesthesia Quick Evaluation

## 2013-10-24 NOTE — Progress Notes (Signed)
Assisted Dr. Fitzgerald with right, ultrasound guided, interscalene  block. Side rails up, monitors on throughout procedure. See vital signs in flow sheet. Tolerated Procedure well. 

## 2013-10-25 ENCOUNTER — Encounter (HOSPITAL_BASED_OUTPATIENT_CLINIC_OR_DEPARTMENT_OTHER): Payer: Self-pay | Admitting: Orthopedic Surgery

## 2013-10-25 NOTE — Op Note (Signed)
NAME:  Shannon [name], Shannon [name]            ACCOUNT NO.:  [id number]  MEDICAL RECORD NO.:  [id number]  LOCATION:                                 FACILITY:  PHYSICIAN:  Loreta Aveaniel F. Disa Riedlinger, M.D. DATE OF BIRTH:  Sep 07, 1985  DATE OF PROCEDURE:  10/24/2013 DATE OF DISCHARGE:  10/24/2013                              OPERATIVE REPORT   PREOPERATIVE DIAGNOSES:  Right shoulder chronic impingement, distal clavicle osteolysis, reactive bursitis.  POSTOPERATIVE DIAGNOSES:  Right shoulder chronic impingement, distal clavicle osteolysis, reactive bursitis with a small anterior labrum tear.  PROCEDURES:  Right shoulder exam under anesthesia with arthroscopy. Debridement of labrum.  Bursectomy, acromioplasty, coracoacromial ligament release, excision of distal clavicle.  SURGEON:  Loreta Aveaniel F. Tetsuo Coppola, M.D.  ASSISTANT:  Odelia GageLindsey Anton PA-C, present throughout the entire case and necessary for timely completion of procedure.  ANESTHESIA:  General.  BLOOD LOSS:  Minimal.  SPECIMENS:  None.  CULTURES:  None.  COMPLICATIONS:  None.  DRESSINGS:  Soft compressive with sling.  DESCRIPTION OF PROCEDURE:  The patient was brought to the operating room and placed on the operating table in supine position.  After adequate anesthesia had been obtained, placed in a beach-chair position. Shoulder positioner, prepped and draped in usual sterile fashion.  Full motion and stable shoulder.  After being prepped and draped, three portals; anterior, posterior and lateral.  Arthroscope was introduced. Shoulder was distended and inspected.  Little tearing of anterior labrum debrided.  Articular cartilage, biceps tendon, biceps anchor, capsule ligamentous structures, undersurface of cuff all looked good.  Cannula redirected subacromially.  Reactive bursitis debrided.  Little roughening on the top of the cuff debrided.  Type 2 acromion. Acromioplasty to a type 1 acromion, releasing the CA ligament.  Grade 3 and 4 changes.   Osteolysis of distal clavicle.  Periarticular spur with lateral centimeter of clavicle resected.  Adequacy of decompression and debridement confirmed viewing from all portals.  Instruments and fluid were removed.  Portals were closed with nylon.  Sterile compressive dressing applied.  Sling applied.  Anesthesia was reversed.  Brought to the recovery room.  Tolerated the surgery well.  No complications.     Loreta Aveaniel F. Aidian Salomon, M.D.     DFM/MEDQ  D:  10/24/2013  T:  10/24/2013  Job:  [id number]

## 2014-02-17 ENCOUNTER — Encounter (HOSPITAL_BASED_OUTPATIENT_CLINIC_OR_DEPARTMENT_OTHER): Payer: Self-pay | Admitting: Orthopedic Surgery

## 2014-05-05 ENCOUNTER — Other Ambulatory Visit (HOSPITAL_COMMUNITY): Payer: PRIVATE HEALTH INSURANCE | Attending: Psychiatry | Admitting: Psychiatry

## 2014-05-05 ENCOUNTER — Encounter (HOSPITAL_COMMUNITY): Payer: Self-pay

## 2014-05-05 DIAGNOSIS — F410 Panic disorder [episodic paroxysmal anxiety] without agoraphobia: Secondary | ICD-10-CM | POA: Insufficient documentation

## 2014-05-05 DIAGNOSIS — F331 Major depressive disorder, recurrent, moderate: Secondary | ICD-10-CM | POA: Insufficient documentation

## 2014-05-05 DIAGNOSIS — F338 Other recurrent depressive disorders: Secondary | ICD-10-CM

## 2014-05-05 DIAGNOSIS — G4700 Insomnia, unspecified: Secondary | ICD-10-CM | POA: Insufficient documentation

## 2014-05-05 DIAGNOSIS — F411 Generalized anxiety disorder: Secondary | ICD-10-CM | POA: Insufficient documentation

## 2014-05-05 NOTE — Progress Notes (Signed)
    Daily Group Progress Note  Program: IOP  Group Time: 9:00-10:30  Participation Level: Active  Behavioral Response: Appropriate  Type of Therapy:  Group Therapy  Summary of Progress: Pt. Met with case manager to complete intake.     Group Time: 10:30-12:00  Participation Level:  Active  Behavioral Response: Appropriate  Type of Therapy: Psycho-education Group  Summary of Progress: Pt. Participated in discussion about developing healthy relationship boundaries.   Nancie Neas, LPC
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
## 2014-05-06 ENCOUNTER — Encounter (HOSPITAL_COMMUNITY): Payer: Self-pay | Admitting: Psychiatry

## 2014-05-06 ENCOUNTER — Other Ambulatory Visit (HOSPITAL_COMMUNITY): Payer: PRIVATE HEALTH INSURANCE | Admitting: Psychiatry

## 2014-05-06 DIAGNOSIS — F331 Major depressive disorder, recurrent, moderate: Secondary | ICD-10-CM

## 2014-05-06 NOTE — Progress Notes (Signed)
Psychiatric Assessment Adult  Patient Identification:  Shannon [name] Date of Evaluation:  05/06/2014 Chief Complaint: depression History of Chief Complaint:  Ms Blazejewski has been depressed all her life she says.  She had a very dysfunctional childhood.  She currently has a good marriage and loves her husband and children but is still depressed.  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  Finances are also a stressor as well as mothering 3 children under the age of 61.  She has tried different medications over the years but does not really give them a fair trial.  Celexa helped in the past.  She does not want to take medications if she does not have to.  She is willing to see a psychiatrist and a therapist.  She plans to quit her job in February and start cosmetology school.  "Every time I think about it, it makes me happy".  She also has a fair amount of anxiety worrying about all the stressors listed above.  HPI Review of Systems Physical Exam  Depressive Symptoms: depressed mood, anhedonia, insomnia, fatigue, feelings of worthlessness/guilt, difficulty concentrating, anxiety, panic attacks, weight loss, decreased appetite,  (Hypo) Manic Symptoms:   Elevated Mood:  Negative Irritable Mood:  Yes Grandiosity:  Negative Distractibility:  Negative Labiality of Mood:  Negative Delusions:  Negative Hallucinations:  Negative Impulsivity:  No Sexually Inappropriate Behavior:  Negative Financial Extravagance:  Negative Flight of Ideas:  Negative  Anxiety Symptoms: Excessive Worry:  Yes Panic Symptoms:  Yes Agoraphobia:  Negative Obsessive Compulsive: Negative  Symptoms: None, Specific Phobias:  Negative Social Anxiety:  Negative  Psychotic Symptoms:  Hallucinations: Negative None Delusions:  Negative Paranoia:  Negative   Ideas of Reference:  Negative  PTSD Symptoms: Ever had a traumatic exposure:  Negative Had a traumatic exposure in the last month:   Negative Re-experiencing: Negative None Hypervigilance:  Negative Hyperarousal: Negative None Avoidance: Negative None  Traumatic Brain Injury: Negative na  Past Psychiatric History: Diagnosis: major depression, recurrent, moderate  Hospitalizations: one aged 29  Outpatient Care: sees PCP  Substance Abuse Care: none  Self-Mutilation: in the past, none in 4 years  Suicidal Attempts: as an early teen ager  Violent Behaviors: none   Past Medical History:   Past Medical History  Diagnosis Date  . 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 Degenerative arthritis of right shoulder region 10/2013  . Disorders of bursae and tendons in shoulder region, unspecified 10/2013    right  . Recent upper respiratory tract infection 10/22/2013    states still has some congestion  . Clenching of teeth     states due to stress   . Anxiety   . Depression    History of Loss of Consciousness:  Negative Seizure History:  Negative Cardiac History:  Negative Allergies:   Allergies  Allergen Reactions  . Penicillins Hives  . Codeine Itching   Current Medications:  No current outpatient prescriptions on file.   No current facility-administered medications for this visit.    Previous Psychotropic Medications:  Medication Dose   none  na                     Substance Abuse History in the last 12 months:none  Medical Consequences of Substance Abuse: none  Legal Consequences of Substance Abuse: none  Family Consequences of Substance Abuse: none  Blackouts:  Negative DT's:  Negative Withdrawal Symptoms:  Negative None  Social History: Current Place of Residence: [place] Place of Birth: [place] Family Members: husband and one step daughter and 2 daughters Marital Status:  Married Children: 2 and one step daughter  Sons: 0  Daughters: 2 Relationships:  good with husband and mother in Social workerlaw Education:  HS Graduate Educational Problems/Performance: poor Religious Beliefs/Practices: not reported History of Abuse: emotional from mother, father absent from her life Armed forces technical officerccupational Experiences; Military History:  None. Legal History: none Hobbies/Interests: none reported  Family History:   Family History  Problem Relation Age of Onset  . Anxiety disorder Sister   . Anxiety disorder Brother   . Bipolar disorder Mother   . Heart disease Maternal Grandfather   . Hypertension Maternal Grandfather   . Cancer Maternal Grandfather   . Depression Sister   . Anxiety disorder Father     Mental Status Examination/Evaluation: Objective:  Appearance: Well Groomed  Eye Contact::  Good  Speech:  Clear and Coherent  Volume:  Normal  Mood:  depressed  Affect:  Appropriate  Thought Process:  Coherent and Logical  Orientation:  Full (Time, Place, and Person)  Thought Content:  Negative  Suicidal Thoughts:  No  Homicidal Thoughts:  No  Judgement:  Intact  Insight:  Good  Psychomotor Activity:  Normal  Akathisia:  Negative  Handed:  Right  AIMS (if indicated):  0  Assets:  Communication Skills Desire for Improvement Financial Resources/Insurance Housing Intimacy Leisure Time Physical Health Social Support Talents/Skills Transportation Vocational/Educational    Laboratory/X-Ray Psychological Evaluation(s)   none  none   Assessment:  Major depression, recurrent, moderate, rule out generalized anxiety disorder                  Treatment Plan/Recommendations:  Plan of Care: daily group therapy  Laboratory:  none  Psychotherapy: group therapy   Medications: none  Routine PRN Medications:  Yes  Consultations: none  Safety Concerns:  none  Other:      Benjaman PottAYLOR,Imelda Dandridge D, MD 1/19/20161:18 PM

## 2014-05-07 ENCOUNTER — Other Ambulatory Visit (HOSPITAL_COMMUNITY): Payer: PRIVATE HEALTH INSURANCE | Admitting: Psychiatry

## 2014-05-07 DIAGNOSIS — F331 Major depressive disorder, recurrent, moderate: Secondary | ICD-10-CM

## 2014-05-07 NOTE — Progress Notes (Signed)
    Daily Group Progress Note  Program: IOP  Group Time: 9:00-10:30  Participation Level: Active  Behavioral Response: Appropriate and Sharing  Type of Therapy:  Group Therapy  Summary of Progress: Pt. Discussed trauma history, stigma of mental health diagnosis, fears of dependence on psychoactive medications, and fears of repeating her mother's mental illness. Pt. Also discussed job dissatisfaction and plans to return to school.     Group Time: 10:30-12:00  Participation Level:  Active  Behavioral Response: Appropriate  Type of Therapy: Psycho-education Group  Summary of Progress: Pt. Watched and discussed Clovia CuffKristin Neff video about developing self-compassion.  Shaune PollackBrown, Jeanni Allshouse B, LPC

## 2014-05-08 ENCOUNTER — Other Ambulatory Visit (HOSPITAL_COMMUNITY): Payer: PRIVATE HEALTH INSURANCE | Admitting: Psychiatry

## 2014-05-08 DIAGNOSIS — F331 Major depressive disorder, recurrent, moderate: Secondary | ICD-10-CM

## 2014-05-09 ENCOUNTER — Other Ambulatory Visit (HOSPITAL_COMMUNITY): Payer: PRIVATE HEALTH INSURANCE

## 2014-05-12 ENCOUNTER — Other Ambulatory Visit (HOSPITAL_COMMUNITY): Payer: PRIVATE HEALTH INSURANCE | Admitting: Psychiatry

## 2014-05-12 DIAGNOSIS — F331 Major depressive disorder, recurrent, moderate: Secondary | ICD-10-CM | POA: Diagnosis not present

## 2014-05-12 DIAGNOSIS — F338 Other recurrent depressive disorders: Secondary | ICD-10-CM

## 2014-05-12 NOTE — Progress Notes (Signed)
    Daily Group Progress Note  Program: IOP  Group Time: 9:00-10:30`  Participation Level: Active  Behavioral Response: Appropriate  Type of Therapy:  Group Therapy  Summary of Progress: Pt was active today. Summary of Progress: Pt. was able to share some experiences in group today. Pt. continues to talk about the poor relationship that she has with her father. She seemed to be more positive today.      Group Time: 10:30-12:00  Participation Level:  Active  Behavioral Response: Appropriate  Type of Therapy: Psycho-education Group  Summary of Progress: Pt. was very active in group. Summary of Progress: Pt. watched Dewain PenningBrene Brown video on the power of vulnerability and completed a self-compassion exercise. Pt. continues to talk about her excitement about starting hair school and reported that as a dream. She admits to being hard on herself and numbing vulnerability.    Shaune PollackBrown, Jennifer B, LPC

## 2014-05-12 NOTE — Progress Notes (Signed)
    Daily Group Progress Note  Program: IOP  Group Time: 9:00-10:30  Participation Level: Active  Behavioral Response: Appropriate  Type of Therapy:  Psycho-education Group  Summary of Progress: Pt. Participated in medication management group with Michelle NasutiElena.     Group Time: 10:30-12:00  Participation Level:  Active  Behavioral Response: Appropriate  Type of Therapy: Group Therapy  Summary of Progress: Pt. Reported that her mood has improved, sometimes that she has to force herself out of bed, but that mornings have been better. Pt. Reported her commitment to leaving her job and enrolling in cosmetology school in the summer.   Shaune PollackBrown, Jennifer B, LPC

## 2014-05-13 ENCOUNTER — Other Ambulatory Visit (HOSPITAL_COMMUNITY): Payer: PRIVATE HEALTH INSURANCE | Admitting: Psychiatry

## 2014-05-13 DIAGNOSIS — F331 Major depressive disorder, recurrent, moderate: Secondary | ICD-10-CM | POA: Diagnosis not present

## 2014-05-14 ENCOUNTER — Other Ambulatory Visit (HOSPITAL_COMMUNITY): Payer: PRIVATE HEALTH INSURANCE | Admitting: Psychiatry

## 2014-05-14 DIAGNOSIS — F331 Major depressive disorder, recurrent, moderate: Secondary | ICD-10-CM | POA: Diagnosis not present

## 2014-05-15 ENCOUNTER — Other Ambulatory Visit (HOSPITAL_COMMUNITY): Payer: PRIVATE HEALTH INSURANCE | Admitting: Psychiatry

## 2014-05-15 DIAGNOSIS — F331 Major depressive disorder, recurrent, moderate: Secondary | ICD-10-CM

## 2014-05-15 NOTE — Progress Notes (Signed)
    Daily Group Progress Note  Program: IOP  Group Time: 9:00-10:30  Participation Level: Active  Behavioral Response: Appropriate  Type of Therapy:  Group Therapy  Summary of Progress: Pt. Expressed strengths of being able to create emotional distance between herself and coworkers. Pt. Discussed being hurt by her husband's family and generally not being accepted by his family.      Group Time: 10:30-12:00  Participation Level:  Active  Behavioral Response: Appropriate  Type of Therapy: Psycho-education Group  Summary of Progress: Pt. was very active in group. Pt. reported holding on to past experiences and dwelling on it. Pt. participated in a guided meditation on affirmation.  She was able to connect with the appropriate themes.   Shaune PollackBrown, Jennifer B, LPC

## 2014-05-15 NOTE — Progress Notes (Signed)
    Daily Group Progress Note  Program: IOP  Group Time: 9:00-10:30  Participation Level: Active  Behavioral Response: Appropriate  Type of Therapy:  Group Therapy  Summary of Progress:  Pt was active today. Pt. was able to share some experiences in group today. Pt. reports having enough energy to organize her house and play with her kids. Pt. made an "I am Poem" and was able to connect her feelings and thoughts in a poem.      Group Time: 10:30-12:00  Participation Level:  Active  Behavioral Response: Appropriate  Type of Therapy: Psycho-education Group  Summary of Progress: Pt. was very active in group. Pt. listened to Alyse from [employer] and was able to learn the technique called "The Next Right Thing". Pt. used her example of going to cosmetology school to talk about her 'next right thing". She was able to connect with the appropriate themes.  Shaune PollackBrown, Jennifer B, LPC

## 2014-05-15 NOTE — Progress Notes (Signed)
    Daily Group Progress Note  Program: IOP  Group Time: 9:00-10:30   Participation Level: High   Behavioral Response: Appropriate   Type of Therapy:  Group Therapy   Summary of Progress: Pt. participated in and contributed to the group conversation. Does not accept bi-polar diagnosis and continues to resist medication. Speaks Spoke frequently about triggers for depression and anxiety, mother's mental illness, and trauma caused by mother's behavior. Pt. Spoke about crafting and dancing as hobbies.   Group Time: 10:30-12:00   Participation Level:  High   Behavioral Response: Appropriate   Type of Therapy: Psycho-education Group   Summary of Progress: Pt. participated in meditation exercise with the group.   Shaune PollackBrown, Jennifer B, LPC

## 2014-05-16 ENCOUNTER — Other Ambulatory Visit (HOSPITAL_COMMUNITY): Payer: PRIVATE HEALTH INSURANCE | Admitting: Psychiatry

## 2014-05-16 DIAGNOSIS — F331 Major depressive disorder, recurrent, moderate: Secondary | ICD-10-CM | POA: Diagnosis not present

## 2014-05-16 NOTE — Progress Notes (Signed)
    Daily Group Progress Note  Program: IOP  Group Time: 9:00-10:30  Participation Level: Active  Behavioral Response: Appropriate  Type of Therapy:  Group Therapy  Summary of Progress: Pt. Reported that she felt very sad after yesterday's group. Pt. Discussed relationship with mother with borderline personality disorder, loss of relationship with mother, and grief related to loss of relationship.     Group Time: 10:30-12:00  Participation Level:  Active  Behavioral Response: Appropriate  Type of Therapy: Psycho-education Group  Summary of Progress: Pt. Participated in grief and loss group facilitated by Theda BelfastBob Hamilton.   Shaune PollackBrown, Jennifer B, LPC

## 2014-05-19 ENCOUNTER — Other Ambulatory Visit (HOSPITAL_COMMUNITY): Payer: PRIVATE HEALTH INSURANCE | Attending: Psychiatry | Admitting: Psychiatry

## 2014-05-19 DIAGNOSIS — F1210 Cannabis abuse, uncomplicated: Secondary | ICD-10-CM | POA: Insufficient documentation

## 2014-05-19 DIAGNOSIS — F339 Major depressive disorder, recurrent, unspecified: Secondary | ICD-10-CM | POA: Insufficient documentation

## 2014-05-19 DIAGNOSIS — F331 Major depressive disorder, recurrent, moderate: Secondary | ICD-10-CM

## 2014-05-19 NOTE — Progress Notes (Signed)
    Daily Group Progress Note  Program: IOP  Group Time: 9:00-10:00  Participation Level: Active  Behavioral Response: Appropriate  Type of Therapy:  Psycho-education Group  Summary of Progress: Pt. Participated in medication management group facilitated by [facility].      Group Time: 10:00-12:00  Participation Level:  Active  Behavioral Response: Appropriate  Type of Therapy: Group Therapy  Summary of Progress: Pt. Presented with bright affect, talked and laughed appropriately. Pt. Discussed that she was very active over the weekend and found joy in baking a cake for her family. Pt. Reported significant change in mood with decision to leave her job and enroll in cosmetology school. Pt. Discussed her husband as most significant source of emotional support.   Shaune PollackBrown, Shannon B, LPC

## 2014-05-20 ENCOUNTER — Other Ambulatory Visit (HOSPITAL_COMMUNITY): Payer: PRIVATE HEALTH INSURANCE | Admitting: Psychiatry

## 2014-05-20 DIAGNOSIS — F331 Major depressive disorder, recurrent, moderate: Secondary | ICD-10-CM

## 2014-05-20 DIAGNOSIS — F339 Major depressive disorder, recurrent, unspecified: Secondary | ICD-10-CM | POA: Diagnosis not present

## 2014-05-21 ENCOUNTER — Other Ambulatory Visit (HOSPITAL_COMMUNITY): Payer: PRIVATE HEALTH INSURANCE | Admitting: Psychiatry

## 2014-05-21 NOTE — Progress Notes (Signed)
    Daily Group Progress Note  Program: IOP  Group Time: 9:00-10:30  Participation Level: Active  Behavioral Response: Appropriate  Type of Therapy:  Group Therapy  Summary of Progress: Pt. reports not feeling like doing anything today. She seemed to be in a down mood. Pt. discussed her plans with hair school.      Group Time: 10:30-12:00  Participation Level:  Active  Behavioral Response: Appropriate  Type of Therapy: Psycho-education Group  Summary of Progress: Pt. learned about the dangers of comparison and ways to boost self-confidence. Pt. was able to talk about some of the positive traits she.         Shaune PollackBrown, Duel Conrad B, LPC

## 2014-05-22 ENCOUNTER — Other Ambulatory Visit (HOSPITAL_COMMUNITY): Payer: PRIVATE HEALTH INSURANCE | Admitting: Psychiatry

## 2014-05-22 DIAGNOSIS — F339 Major depressive disorder, recurrent, unspecified: Secondary | ICD-10-CM | POA: Diagnosis not present

## 2014-05-22 DIAGNOSIS — F331 Major depressive disorder, recurrent, moderate: Secondary | ICD-10-CM

## 2014-05-22 NOTE — Progress Notes (Signed)
    Daily Group Progress Note  Program: IOP  Group Time: 9:00-10:30  Participation Level: Active  Behavioral Response: Appropriate  Type of Therapy:  Group Therapy  Summary of Progress: Pt. Presented as depressed and tearful. Pt. Reported that she felt depressed yesterday and spent most of the day in bed. Pt. Discussed narrative related to fears of repeating her mother's patterns of mental illness and causing chaos and confusion in her family.     Group Time: 10:30-12:00  Participation Level:  Active  Behavioral Response: Appropriate  Type of Therapy: Psycho-education Group  Summary of Progress: Pt. Participated in journaling exercise "If I believed I was enough..."  Shaune PollackBrown, Zanyia Silbaugh B, LPC

## 2014-05-22 NOTE — Progress Notes (Signed)
  [facility] Discharge Summary  Smiley HousemanKimberley Mahabir [id number]  Admission date: 05/05/2014 Discharge date: 05/26/2014  Reason for admission: depression and anxiety  Chemical Use History: none  Family of Origin Issues: none of current relevance  Progress in Program Toward Treatment Goals: feels much better, anxious about returning to work but knows it just has to be done.  Wonders if she has ADHD but the symptoms she describes are also consistent with anxiety  Progress (rationale): talking to others was helpful she says.  Says learning coping skills was very helpful.  Realizing she has an anxiety disorder was helpful in understanding why she feels the way she does, she says.    Benjaman PottAYLOR,Marlene Beidler D, MD 05/22/2014

## 2014-05-23 ENCOUNTER — Other Ambulatory Visit (HOSPITAL_COMMUNITY): Payer: PRIVATE HEALTH INSURANCE | Admitting: Psychiatry

## 2014-05-23 DIAGNOSIS — F339 Major depressive disorder, recurrent, unspecified: Secondary | ICD-10-CM | POA: Diagnosis not present

## 2014-05-23 DIAGNOSIS — F331 Major depressive disorder, recurrent, moderate: Secondary | ICD-10-CM

## 2014-05-23 NOTE — Patient Instructions (Signed)
Pt will complete MH-IOP on 05-26-14.  Will follow up with Dr. Lolly MustacheArfeen on 05-28-14 @ 9 a.m and Jovea Herbin, LCSW on 05-29-14 @ 9 a.m. (arrive 15 minutes early).  Encouraged support groups.  Return to work on 06-02-14, without any restrictions.

## 2014-05-23 NOTE — Progress Notes (Signed)
    Daily Group Progress Note  Program: IOP  Group Time: 9:00-10:30  Participation Level: Active  Behavioral Response: Appropriate  Type of Therapy:  Group Therapy  Summary of Progress: Pt. Reported that yesterday's group was difficult for her because she was finally allowing herself to grieve loss of relationship with her mother due to mental illness and fears of being like her mother. Pt. Also discussed desire to be more accomplished in her life.     Group Time: 10:30-12:00  Participation Level:  Active  Behavioral Response: Appropriate  Type of Therapy: Psycho-education Group  Summary of Progress: Pt. Was alert and attentive in grief and loss group.  Shaune PollackBrown, Jennifer B, LPC

## 2014-05-26 ENCOUNTER — Other Ambulatory Visit (HOSPITAL_COMMUNITY): Payer: PRIVATE HEALTH INSURANCE | Admitting: Psychiatry

## 2014-05-26 DIAGNOSIS — F331 Major depressive disorder, recurrent, moderate: Secondary | ICD-10-CM

## 2014-05-26 DIAGNOSIS — F339 Major depressive disorder, recurrent, unspecified: Secondary | ICD-10-CM | POA: Diagnosis not present

## 2014-05-26 NOTE — Progress Notes (Signed)
Shannon [name] is a 29 y.o. , married, employed, Caucasian female, who was a self referral. Pt was admitted due to worsening depressive and anxiety symptoms, hx of panic attacks, SI no plan or intent, and anger outbursts. Pt denied HI or A/V hallucinations. Discussed safety options with pt at length upon admission. Pt was able to contract for safety. Reported family as being her deterrent. Other symptoms included: Irritability, agitation, tearful, poor sleep, decreased appetite (has lost 15 lbs since Dec. 2015), isolative, low energy, poor concentration, no motivation, anhedonia, ruminating thoughts, feelings of hopelessness, helplessness and worthlessness. Pt reported she normally felt that way during the winter months and is more anxious in spring/summer months. Pt stateed that her anger outbursts consists of throwing objects in the home, screaming, and admits to pushing husband once. Reported being admitted once at age 29 at a psychiatric hospital due to SI (cut wrists). Was admitted for one month. Pt admitted to hx of self mutiltative behavior (cutting on both legs). Stated she most recently did it four years ago. Pt has had a hx of seeing a therapist two yrs ago. Reported PCP (Dr. Jacqulyn BathLong) has tried her on several previous medications which failed. According to pt, Celexa has helped her in the past; but she stopped taking it. Stressors/Triggers: 1) Financial Issues: "I live paycheck to paycheck." Pt reported a lot of debt. 2) Job ([employer]) of ten years. 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 "I feel like I'm drowning." Pt stated she felt stuck in the job. Reported that her quota isn't up to par. 3) Parenting three kids (5 yo stepdaughter, 634 yo daughter, 2 yo daughter). Mother-in-law babysit while pt and husband works. Stepdaughter is with her own biological mother 50% of the time. 4) Hx of non-compliancy with medications. Family Hx: Mother (bipolar); Father  (anxiety); Sister and Brother (depression) Pt completed MH-IOP today.  Reports she continues to struggle with all the above symptoms still.  Denies SI/HI or A/V hallucinations.  States that the groups were helpful.  A:  D/C today.  F/U with Dr. Lolly MustacheArfeen on 05-28-14 @ 9 a.m and Jovea Herbin, LCSW on 05-29-14 @ 9 a.m..  Encouraged support groups.  RTW on 06-02-14, without any restrictions.  R:  Pt receptive.

## 2014-05-27 ENCOUNTER — Other Ambulatory Visit (HOSPITAL_COMMUNITY): Payer: PRIVATE HEALTH INSURANCE

## 2014-05-28 ENCOUNTER — Ambulatory Visit (INDEPENDENT_AMBULATORY_CARE_PROVIDER_SITE_OTHER): Payer: PRIVATE HEALTH INSURANCE | Admitting: Psychiatry

## 2014-05-28 ENCOUNTER — Encounter (HOSPITAL_COMMUNITY): Payer: Self-pay | Admitting: Psychiatry

## 2014-05-28 ENCOUNTER — Other Ambulatory Visit (HOSPITAL_COMMUNITY): Payer: PRIVATE HEALTH INSURANCE

## 2014-05-28 VITALS — BP 120/67 | HR 73 | Ht 70.0 in | Wt 157.2 lb

## 2014-05-28 DIAGNOSIS — F3131 Bipolar disorder, current episode depressed, mild: Secondary | ICD-10-CM

## 2014-05-28 DIAGNOSIS — F1210 Cannabis abuse, uncomplicated: Secondary | ICD-10-CM | POA: Diagnosis not present

## 2014-05-28 DIAGNOSIS — F331 Major depressive disorder, recurrent, moderate: Secondary | ICD-10-CM

## 2014-05-28 MED ORDER — CITALOPRAM HYDROBROMIDE 20 MG PO TABS: 20.0000 mg | ORAL_TABLET | Freq: Every day | ORAL | Status: DC

## 2014-05-28 MED ORDER — LAMOTRIGINE 25 MG PO TABS: ORAL_TABLET | ORAL | Status: DC

## 2014-05-28 NOTE — Progress Notes (Signed)
    Daily Group Progress Note  Program: IOP  Group Time: 9:00-10:30  Participation Level: Minimal  Behavioral Response: Appropriate  Type of Therapy:  Psycho-education Group  Summary of Progress: Pt was minimally active today. Pt. learned about various mental health medications with the pharmacist MaconElena.      Group Time: 10:30-12:00  Participation Level:  Active  Behavioral Response: Appropriate  Type of Therapy: Group Therapy  Summary of Progress: Pt. was active in group. Pt. learned about symptoms of panic attacks and what causes them to occur, treatment options, and discussed how family members or others unintentionally say negative comments about depression and anxiety. Pt. watched a video on what a panic attack can look like and was able to share some experiences of her having a panic attack. Pt. was excited about discharging today and is ready to start her plans with hair school in July.   Shaune PollackBrown, Jennifer B, LPC
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
## 2014-05-29 ENCOUNTER — Other Ambulatory Visit (HOSPITAL_COMMUNITY): Payer: PRIVATE HEALTH INSURANCE

## 2014-05-30 ENCOUNTER — Other Ambulatory Visit (HOSPITAL_COMMUNITY): Payer: PRIVATE HEALTH INSURANCE

## 2014-06-02 ENCOUNTER — Other Ambulatory Visit (HOSPITAL_COMMUNITY): Payer: PRIVATE HEALTH INSURANCE

## 2014-06-03 ENCOUNTER — Other Ambulatory Visit (HOSPITAL_COMMUNITY): Payer: PRIVATE HEALTH INSURANCE

## 2014-06-04 ENCOUNTER — Other Ambulatory Visit (HOSPITAL_COMMUNITY): Payer: PRIVATE HEALTH INSURANCE

## 2014-06-05 ENCOUNTER — Other Ambulatory Visit (HOSPITAL_COMMUNITY): Payer: PRIVATE HEALTH INSURANCE

## 2014-06-06 ENCOUNTER — Other Ambulatory Visit (HOSPITAL_COMMUNITY): Payer: PRIVATE HEALTH INSURANCE

## 2014-06-09 ENCOUNTER — Emergency Department (HOSPITAL_COMMUNITY)
Admission: EM | Admit: 2014-06-09 | Discharge: 2014-06-09 | Disposition: A | Discharge status: Home / Self Care | Payer: 59 | Attending: Emergency Medicine | Admitting: Emergency Medicine

## 2014-06-09 ENCOUNTER — Encounter (HOSPITAL_COMMUNITY): Payer: Self-pay | Admitting: *Deleted

## 2014-06-09 ENCOUNTER — Other Ambulatory Visit (HOSPITAL_COMMUNITY): Payer: PRIVATE HEALTH INSURANCE

## 2014-06-09 DIAGNOSIS — Z79899 Other long term (current) drug therapy: Secondary | ICD-10-CM | POA: Insufficient documentation

## 2014-06-09 DIAGNOSIS — Z880 Allergy status to penicillin: Secondary | ICD-10-CM | POA: Diagnosis not present

## 2014-06-09 DIAGNOSIS — F419 Anxiety disorder, unspecified: Secondary | ICD-10-CM | POA: Diagnosis not present

## 2014-06-09 DIAGNOSIS — J029 Acute pharyngitis, unspecified: Secondary | ICD-10-CM | POA: Diagnosis not present

## 2014-06-09 DIAGNOSIS — F329 Major depressive disorder, single episode, unspecified: Secondary | ICD-10-CM | POA: Diagnosis not present

## 2014-06-09 DIAGNOSIS — R000 Tachycardia, unspecified: Secondary | ICD-10-CM | POA: Insufficient documentation

## 2014-06-09 DIAGNOSIS — R112 Nausea with vomiting, unspecified: Secondary | ICD-10-CM | POA: Diagnosis present

## 2014-06-09 DIAGNOSIS — Z3202 Encounter for pregnancy test, result negative: Secondary | ICD-10-CM | POA: Insufficient documentation

## 2014-06-09 DIAGNOSIS — M791 Myalgia: Secondary | ICD-10-CM | POA: Insufficient documentation

## 2014-06-09 DIAGNOSIS — R1110 Vomiting, unspecified: Secondary | ICD-10-CM

## 2014-06-09 DIAGNOSIS — Z87891 Personal history of nicotine dependence: Secondary | ICD-10-CM | POA: Diagnosis not present

## 2014-06-09 LAB — COMPREHENSIVE METABOLIC PANEL
ALBUMIN: 4.1 g/dL (ref 3.5–5.2)
ALT: 12 U/L (ref 0–35)
AST: 22 U/L (ref 0–37)
Alkaline Phosphatase: 76 U/L (ref 39–117)
Anion gap: 7 (ref 5–15)
BUN: 8 mg/dL (ref 6–23)
CHLORIDE: 106 mmol/L (ref 96–112)
CO2: 23 mmol/L (ref 19–32)
Calcium: 9.3 mg/dL (ref 8.4–10.5)
Creatinine, Ser: 0.68 mg/dL (ref 0.50–1.10)
GLUCOSE: 107 mg/dL — AB (ref 70–99)
Potassium: 3.5 mmol/L (ref 3.5–5.1)
Sodium: 136 mmol/L (ref 135–145)
Total Bilirubin: 0.8 mg/dL (ref 0.3–1.2)
Total Protein: 7.5 g/dL (ref 6.0–8.3)

## 2014-06-09 LAB — CBC WITH DIFFERENTIAL/PLATELET
BASOS ABS: 0 10*3/uL (ref 0.0–0.1)
Basophils Relative: 0 % (ref 0–1)
EOS ABS: 0 10*3/uL (ref 0.0–0.7)
Eosinophils Relative: 0 % (ref 0–5)
HCT: 38.4 % (ref 36.0–46.0)
Hemoglobin: 13 g/dL (ref 12.0–15.0)
LYMPHS PCT: 6 % — AB (ref 12–46)
Lymphs Abs: 1.3 10*3/uL (ref 0.7–4.0)
MCH: 30.2 pg (ref 26.0–34.0)
MCHC: 33.9 g/dL (ref 30.0–36.0)
MCV: 89.1 fL (ref 78.0–100.0)
Monocytes Absolute: 1.4 10*3/uL — ABNORMAL HIGH (ref 0.1–1.0)
Monocytes Relative: 6 % (ref 3–12)
NEUTROS ABS: 21.1 10*3/uL — AB (ref 1.7–7.7)
Neutrophils Relative %: 88 % — ABNORMAL HIGH (ref 43–77)
Platelets: 314 10*3/uL (ref 150–400)
RBC: 4.31 MIL/uL (ref 3.87–5.11)
RDW: 12.5 % (ref 11.5–15.5)
WBC: 23.8 10*3/uL — ABNORMAL HIGH (ref 4.0–10.5)

## 2014-06-09 LAB — I-STAT CG4 LACTIC ACID, ED: LACTIC ACID, VENOUS: 1.38 mmol/L (ref 0.5–2.0)

## 2014-06-09 LAB — POC URINE PREG, ED: PREG TEST UR: NEGATIVE

## 2014-06-09 MED ORDER — ONDANSETRON HCL 4 MG/2ML IJ SOLN
4.0000 mg | Freq: Once | INTRAMUSCULAR | Status: AC
  Administered 2014-06-09: 4 mg via INTRAVENOUS
  Filled 2014-06-09: qty 2

## 2014-06-09 MED ORDER — ONDANSETRON HCL 4 MG PO TABS: 4.0000 mg | ORAL_TABLET | Freq: Four times a day (QID) | ORAL | Status: DC

## 2014-06-09 NOTE — ED Notes (Signed)
Spoke with MD. PO challenge ordered. Pt requested gingerale.

## 2014-06-09 NOTE — ED Notes (Signed)
Pt reports omiting 24 times in lst 24 hrs. Pt also  reports body aches and fever. Pt denies anydiarrhea at this time.

## 2014-06-09 NOTE — ED Provider Notes (Signed)
CSN: [id number]     Arrival date & time 06/09/14  0705 History   First MD Initiated Contact with Patient 06/09/14 0720     Chief Complaint  Patient presents with  . Nausea  . Emesis  . Sore Throat     (Consider location/radiation/quality/duration/timing/severity/associated sxs/prior Treatment) Patient is a 29 y.o. female presenting with vomiting and pharyngitis. The history is provided by the patient and the spouse.  Emesis Severity:  Severe Duration:  24 hours Timing:  Constant Number of daily episodes:  20 Quality:  Stomach contents How soon after eating does vomiting occur:  10 minutes Progression:  Unchanged Chronicity:  New Relieved by:  Nothing Worsened by:  Food smell and liquids Ineffective treatments:  Liquids Associated symptoms: chills, fever, myalgias, sore throat and URI   Associated symptoms: no abdominal pain and no diarrhea   Risk factors: sick contacts   Risk factors: no alcohol use, no diabetes and not pregnant now   Risk factors comment:  Daughter with a day of vomiting about 5 days ago Sore Throat Pertinent negatives include no abdominal pain.    Past Medical History  Diagnosis Date  . 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 Degenerative arthritis of right shoulder region 10/2013  . Disorders of bursae and tendons in shoulder region, unspecified 10/2013    right  . Recent upper respiratory tract infection 10/22/2013    states still has some congestion  . Clenching of teeth     states due to stress   . Anxiety   . Depression    Past Surgical History  Procedure Laterality Date  . Tonsillectomy    . Wisdom tooth extraction    . Tubal ligation  10/19/2011    Procedure: POST PARTUM TUBAL LIGATION;  Surgeon: Dorien Chihuahua. Richardson Dopp, MD;  Location: [facility];  Service: Gynecology;  Laterality: Bilateral;  . Shoulder arthroscopy with distal clavicle resection Right 10/24/2013    Procedure: RIGHT SHOULDER DEBRIDEMENT EXTENSIVE, DISTAL  CLAVICULECTOMY, DECOMPRESSION SUBACROMIAL PARTIAL ACROMIOPLASTY WITH DEBRIDEMENT OF LABRIUM;  Surgeon: Loreta Ave, MD;  Location: [facility];  Service: Orthopedics;  Laterality: Right;   Family History  Problem Relation Age of Onset  . Anxiety disorder Sister   . Anxiety disorder Brother   . Bipolar disorder Mother   . Heart disease Maternal Grandfather   . Hypertension Maternal Grandfather   . Cancer Maternal Grandfather   . Depression Sister   . Anxiety disorder Father    History  Substance Use Topics  . Smoking status: Former Smoker    Quit date: 10/13/2009  . Smokeless tobacco: Never Used  . Alcohol Use: No   OB History    Gravida Para Term Preterm AB TAB SAB Ectopic Multiple Living   0 1 0 0 2     Review of Systems  Constitutional: Positive for chills.  HENT: Positive for sore throat.   Gastrointestinal: Positive for vomiting. Negative for abdominal pain and diarrhea.  Musculoskeletal: Positive for myalgias.  All other systems reviewed and are negative.     Allergies  Penicillins and Codeine  Home Medications   Prior to Admission medications   Medication Sig Start Date End Date Taking? Authorizing Provider  citalopram (CELEXA) 20 MG tablet Take 1 tablet (20 mg total) by mouth daily. 05/28/14   Cleotis Nipper, MD  lamoTRIgine (LAMICTAL) 25 MG tablet Take 1 tab daily for 1 week and than 2 tab daily 05/28/14  Cleotis NipperSyed T Arfeen, MD   BP 119/74 mmHg  Pulse 106  Temp(Src) 98.2 F (36.8 C) (Oral)  Resp 20  Ht 5\' 10"  (1.778 m)  Wt 157 lb (71.215 kg)  BMI 22.53 kg/m2  SpO2 100%  LMP 06/08/2014  Breastfeeding? No Physical Exam  Constitutional: She is oriented to person, place, and time. She appears well-developed and well-nourished. No distress.  HENT:  Head: Normocephalic and atraumatic.  Right Ear: Tympanic membrane and ear canal normal.  Left Ear: Tympanic membrane and ear canal normal.  Nose: Mucosal edema present.  Mouth/Throat:  Posterior oropharyngeal erythema present. No oropharyngeal exudate or posterior oropharyngeal edema.  Tonsils absent   Eyes: Conjunctivae and EOM are normal. Pupils are equal, round, and reactive to light.  Neck: Normal range of motion. Neck supple.  Cardiovascular: Regular rhythm and intact distal pulses.  Tachycardia present.   No murmur heard. Pulmonary/Chest: Effort normal and breath sounds normal. No respiratory distress. She has no wheezes. She has no rales.  Abdominal: Soft. She exhibits no distension. There is no tenderness. There is no rebound and no guarding.  Musculoskeletal: Normal range of motion. She exhibits no edema or tenderness.  Neurological: She is alert and oriented to person, place, and time.  Skin: Skin is warm and dry. No rash noted. No erythema.  Psychiatric: She has a normal mood and affect. Her behavior is normal.  Nursing note and vitals reviewed.   ED Course  Procedures (including critical care time) Labs Review Labs Reviewed  CBC WITH DIFFERENTIAL/PLATELET - Abnormal; Notable for the following:    WBC 23.8 (*)    Neutrophils Relative % 88 (*)    Neutro Abs 21.1 (*)    Lymphocytes Relative 6 (*)    Monocytes Absolute 1.4 (*)    All other components within normal limits  COMPREHENSIVE METABOLIC PANEL - Abnormal; Notable for the following:    Glucose, Bld 107 (*)    All other components within normal limits  POC URINE PREG, ED  I-STAT CG4 LACTIC ACID, ED    Imaging Review No results found.   EKG Interpretation None      MDM   Final diagnoses:  Intractable vomiting with nausea, vomiting of unspecified type    Pt with symptoms most consistent with a viral process with fever/vomitting/sore throat and drainage.  Denies bad food exposure and recent travel out of the country.  No recent abx.  No hx concerning for GU pathology or kidney stones.  Pt is awake and alert on exam without peritoneal signs.  Mild erythema to the posterior pharynx however  patient has had her tonsils removed.  CBC, CMP, lactate, UPT pending. Patient given IV fluids and Zofran. Tachycardia but otherwise normal vital signs   10:07 AM Labs normal except for leukocytosis of 23,000 which is most likely reactive. After 1 L of fluid and Zofran patient is feeling better. We'll by mouth challenge.  11:51 AM Pt tolerating po's.  Will d/c home to f/u with PCP if sx worsening.  Gwyneth SproutWhitney Lexii Walsh, MD 06/09/14 1151

## 2014-06-09 NOTE — ED Notes (Addendum)
Pt reports vomiting >15 times in 24 hours. Unable to keep food/fluid down since yesterday morning. Pt menstrual period last Friday (06/06/2014). Denies any pain at this time, only nausea. Denies any diarrhea. Pt does report sore throat.

## 2014-06-09 NOTE — Discharge Instructions (Signed)

## 2014-06-10 ENCOUNTER — Other Ambulatory Visit (HOSPITAL_COMMUNITY): Payer: PRIVATE HEALTH INSURANCE

## 2014-06-11 ENCOUNTER — Other Ambulatory Visit (HOSPITAL_COMMUNITY): Payer: PRIVATE HEALTH INSURANCE

## 2014-06-11 ENCOUNTER — Ambulatory Visit (HOSPITAL_COMMUNITY): Payer: Self-pay | Admitting: Psychiatry

## 2014-06-12 ENCOUNTER — Other Ambulatory Visit (HOSPITAL_COMMUNITY): Payer: PRIVATE HEALTH INSURANCE

## 2014-06-13 ENCOUNTER — Other Ambulatory Visit (HOSPITAL_COMMUNITY): Payer: PRIVATE HEALTH INSURANCE

## 2014-06-16 ENCOUNTER — Other Ambulatory Visit (HOSPITAL_COMMUNITY): Payer: PRIVATE HEALTH INSURANCE

## 2014-06-18 ENCOUNTER — Ambulatory Visit (HOSPITAL_COMMUNITY): Payer: Self-pay | Admitting: Psychiatry

## 2014-06-25 ENCOUNTER — Ambulatory Visit (HOSPITAL_COMMUNITY): Payer: Self-pay | Admitting: Clinical

## 2014-07-11 ENCOUNTER — Other Ambulatory Visit (HOSPITAL_COMMUNITY): Payer: Self-pay | Admitting: Psychiatry

## 2014-07-14 NOTE — Telephone Encounter (Signed)
Telephone message left on patient's voicemail to question if she was going to reschedule an appointment with Dr. Lolly MustacheArfeen as patient no showed for her last appointment and did not follow through with IOP.  Will question if patient should have medications refilled until appointment scheduled due to recent start of Lamictal and missing appointments.

## 2014-07-22 ENCOUNTER — Telehealth (HOSPITAL_COMMUNITY): Payer: Self-pay | Admitting: *Deleted

## 2014-07-22 NOTE — Telephone Encounter (Signed)
Dr. Lolly MustacheArfeen,   Patient scheduled to see you on Thursday.  Patient no showed appointment on 06-18-14 with you.  Patient no showed appointment on 06-25-14 with [facility]ie.  Patient stated she is completely out of Lamictal.  Patient stated that pharmacy gave her a supply of Lamictal, emergency back up and now she is completely out.  Has not taken the medication for a week..Marland Kitchen

## 2014-07-24 ENCOUNTER — Encounter (HOSPITAL_COMMUNITY): Payer: Self-pay | Admitting: Psychiatry

## 2014-07-24 ENCOUNTER — Ambulatory Visit (INDEPENDENT_AMBULATORY_CARE_PROVIDER_SITE_OTHER): Payer: 59 | Admitting: Psychiatry

## 2014-07-24 VITALS — BP 103/76 | HR 69 | Ht 70.0 in | Wt 152.2 lb

## 2014-07-24 DIAGNOSIS — F331 Major depressive disorder, recurrent, moderate: Secondary | ICD-10-CM

## 2014-07-24 DIAGNOSIS — F3131 Bipolar disorder, current episode depressed, mild: Secondary | ICD-10-CM

## 2014-07-24 DIAGNOSIS — F339 Major depressive disorder, recurrent, unspecified: Secondary | ICD-10-CM

## 2014-07-24 MED ORDER — CITALOPRAM HYDROBROMIDE 40 MG PO TABS: 40.0000 mg | ORAL_TABLET | Freq: Every day | ORAL | Status: DC

## 2014-07-24 MED ORDER — LAMOTRIGINE 25 MG PO TABS: 50.0000 mg | ORAL_TABLET | Freq: Every day | ORAL | Status: DC

## 2014-07-24 NOTE — Progress Notes (Signed)
[facility] [phone] Progress Note  Shannon [name] [id number] 29 y.o.  07/24/2014 11:52 AM  Chief Complaint:  I missed appointment.  I am out of Lamictal.  I need Lamictal because it was helping me.    History of Present Illness:  Shannon [name] came for her follow-up appointment.  She was seen first time on February 10th as initial evaluation.  She was referred from intensive outpatient program.  She was started on Lamictal and continued Celexa 20 mg daily.  She's seen much improvement with Lamictal.  She is less irritable, less agitated and her depression improved.  However she was fired from her job [employer] due to lack of attendance.  She is thinking to start school and wants to do cosmetology in fall.  She missed appointment because her daughters were sick.  She admitted adding Lamictal help her energy, motivation, attention and concentration.  She denies any recent rage or anger issues.  Patient denies any rash or itching.  She denies any self abusive behavior.  She is taking Celexa 20 mg but wondering if the dose can increase because she used to take 40 mg.  She admitted sometime feeling sad and isolated but denies any active or passive suicidal thoughts or homicidal thought.  She denies any nightmares or any feeling of hopelessness.  Patient lives with her husband and her 3 children's .  Her husband is very supportive.  She is still smoke marijuana however intensity and frequency is reduced.  Patient denies drinking since she was seen last time.  Her appetite is okay.  Her vitals are stable.  Suicidal Ideation: No Plan Formed: No Patient has means to carry out plan: No  Homicidal Ideation: No Plan Formed: No Patient has means to carry out plan: No  Past Psychiatric History/Hospitalization(s) Patient has one psychiatric hospitalization when she was 29 years old in [place].  At that time she has suicidal thoughts however she never had suicidal attempt.  She endorse history of  cutting herself at that time and denies any recent self abusive behavior.  Patient endorse history of severe mood swing, rage, anger, irritability and angry outbursts.  She had tried Zoloft which actually make her more depressed. Anxiety: Yes Bipolar Disorder: History of mood swing and anger Depression: Yes Mania: Yes Psychosis: No Schizophrenia: No Personality Disorder: No Hospitalization for psychiatric illness: Yes History of Electroconvulsive Shock Therapy: No Prior Suicide Attempts: No  Medical History; Patient has right shoulder joint pain, history of tonsillectomy and shoulder arthroscopy.  Her primary care physician is Dr. Laverta Baltimore.  Patient denies any seizures or any headaches.  Review of Systems  Constitutional: Negative.   Skin: Negative for itching and rash.  Neurological: Negative for tremors.  Psychiatric/Behavioral: Positive for substance abuse. Negative for depression and suicidal ideas.     Psychiatric: Agitation: No Hallucination: No Depressed Mood: No Insomnia: No Hypersomnia: No Altered Concentration: No Feels Worthless: No Grandiose Ideas: No Belief In Special Powers: No New/Increased Substance Abuse: No Compulsions: No  Neurologic: Headache: No Seizure: No Paresthesias: No   Musculoskeletal: Strength & Muscle Tone: within normal limits Gait & Station: normal Patient leans: N/A   Outpatient Encounter Prescriptions as of 07/24/2014  Medication Sig  . citalopram (CELEXA) 40 MG tablet Take 1 tablet (40 mg total) by mouth daily.  Marland Kitchen lamoTRIgine (LAMICTAL) 25 MG tablet Take 2 tablets (50 mg total) by mouth daily.  . [DISCONTINUED] citalopram (CELEXA) 20 MG tablet Take 1 tablet (20 mg total) by mouth daily.  . [  DISCONTINUED] lamoTRIgine (LAMICTAL) 25 MG tablet Take 1 tab daily for 1 week and than 2 tab daily (Patient taking differently: Take 50 mg by mouth daily. )  . [DISCONTINUED] ondansetron (ZOFRAN) 4 MG tablet Take 1 tablet (4 mg total) by mouth  every 6 (six) hours. (Patient not taking: Reported on 07/24/2014)    Recent Results (from the past 2160 hour(s))  CBC with Differential/Platelet     Status: Abnormal   Collection Time: 06/09/14  7:40 AM  Result Value Ref Range   WBC 23.8 (H) 4.0 - 10.5 K/uL   RBC 4.31 3.87 - 5.11 MIL/uL   Hemoglobin 13.0 12.0 - 15.0 g/dL   HCT 38.4 36.0 - 46.0 %   MCV 89.1 78.0 - 100.0 fL   MCH 30.2 26.0 - 34.0 pg   MCHC 33.9 30.0 - 36.0 g/dL   RDW 12.5 11.5 - 15.5 %   Platelets 314 150 - 400 K/uL   Neutrophils Relative % 88 (H) 43 - 77 %   Neutro Abs 21.1 (H) 1.7 - 7.7 K/uL   Lymphocytes Relative 6 (L) 12 - 46 %   Lymphs Abs 1.3 0.7 - 4.0 K/uL   Monocytes Relative 6 3 - 12 %   Monocytes Absolute 1.4 (H) 0.1 - 1.0 K/uL   Eosinophils Relative 0 0 - 5 %   Eosinophils Absolute 0.0 0.0 - 0.7 K/uL   Basophils Relative 0 0 - 1 %   Basophils Absolute 0.0 0.0 - 0.1 K/uL  Comprehensive metabolic panel     Status: Abnormal   Collection Time: 06/09/14  7:40 AM  Result Value Ref Range   Sodium 136 135 - 145 mmol/L   Potassium 3.5 3.5 - 5.1 mmol/L   Chloride 106 96 - 112 mmol/L   CO2 23 19 - 32 mmol/L   Glucose, Bld 107 (H) 70 - 99 mg/dL   BUN 8 6 - 23 mg/dL   Creatinine, Ser 0.68 0.50 - 1.10 mg/dL   Calcium 9.3 8.4 - 10.5 mg/dL   Total Protein 7.5 6.0 - 8.3 g/dL   Albumin 4.1 3.5 - 5.2 g/dL   AST 22 0 - 37 U/L   ALT 12 0 - 35 U/L   Alkaline Phosphatase 76 39 - 117 U/L   Total Bilirubin 0.8 0.3 - 1.2 mg/dL   GFR calc non Af Amer >90 >90 mL/min   GFR calc Af Amer >90 >90 mL/min    Comment: (NOTE) The eGFR has been calculated using the CKD EPI equation. This calculation has not been validated in all clinical situations. eGFR's persistently <90 mL/min signify possible Chronic Kidney Disease.    Anion gap 7 5 - 15  I-Stat CG4 Lactic Acid, ED     Status: None   Collection Time: 06/09/14  8:02 AM  Result Value Ref Range   Lactic Acid, Venous 1.38 0.5 - 2.0 mmol/L  POC urine preg, ED (not at [facility])      Status: None   Collection Time: 06/09/14  8:25 AM  Result Value Ref Range   Preg Test, Ur NEGATIVE NEGATIVE    Comment:        THE SENSITIVITY OF THIS METHODOLOGY IS >24 mIU/mL       Constitutional:  BP 103/76 mmHg  Pulse 69  Ht 5' 10"  (1.778 m)  Wt 152 lb 3.2 oz (69.037 kg)  BMI 21.84 kg/m2   Mental Status Examination;  Patient is a young female who is casually dressed and fairly groomed.  She  she is pleasant and cooperative.  She maintained good eye contact.  Her speech is fast but coherent.  Her thought process is logical and goal-directed.  She described her mood euthymic and her affect is mood appropriate.  Her psychomotor activity is normal.   There were no delusions or any paranoia.  She denies any active or passive suicidal thoughts or homicidal thought.  She denies any auditory or visual hallucination.  Her attention concentration is fair.  There were no flight of ideas or any loose association.  Her cognition is grossly intact.  She's alert and oriented 3.  Her insight judgment and impulse control is okay.   Established Problem, Stable/Improving (1), Review of Psycho-Social Stressors (1), Review and summation of old records (2), Review of Last Therapy Session (1), Review of Medication Regimen & Side Effects (2) and Review of New Medication or Change in Dosage (2)  Assessment: Axis I: Major depressive disorder, recurrent.  Rule out bipolar disorder depressed type, cannabis abuse  Axis II: Deferred  Axis III:  Past Medical History  Diagnosis Date  . 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 Degenerative arthritis of right shoulder region 10/2013  . Disorders of bursae and tendons in shoulder region, unspecified 10/2013    right  . Recent upper respiratory tract infection 10/22/2013    states still has some congestion  . Clenching of teeth     states due to stress   . Anxiety   . Depression      Plan:  Patient improved with the addition of  Lamictal however she is still complaining of sadness sometimes.  She used to take Celexa 40 mg.  I recommended to try Celexa 40 mg.  Continue Lamictal 50 mg daily.  She has no rash itching or any side effects.  At this time patient is not sure about the counseling because she has no job and not sure if she will continue her current insurance.  I recommended once she resolved the insurance issue that she should call us so we can schedule appointment with therapist.  Discussed medication side effects and benefits.  Recommended to call us back if she has any question or any concern.  Patient need hard copy of Lamictal and Celexa which was given.  I will see her again in 2 months.  Time spent 25 minutes.  More than 50% of the time spent in psychoeducation, counseling and coordination of care.  Discuss safety plan that anytime having active suicidal thoughts or homicidal thoughts then patient need to call 911 or go to the local emergency room.    Estera Ozier T., MD 07/24/2014

## 2014-07-25 ENCOUNTER — Telehealth (HOSPITAL_COMMUNITY): Payer: Self-pay | Admitting: *Deleted

## 2014-07-25 NOTE — Telephone Encounter (Signed)
Dr. Lolly MustacheArfeen,   Patient has no insurance.  [facility] cost [facility] at [facility]. Patient cannot afford medication.  Patient has been off Lamictal x 1 week.  Patient stated is there something cheaper she can take.

## 2014-07-25 NOTE — Telephone Encounter (Signed)
Telephone call back with patient regarding cost of her Lamicatal and not being able to afford the medication currently.  Informed patient Dr. Lolly MustacheArfeen did not want to take her back off Lamictal if at all possible due to the fact patient states medication is helping and there really is no other options similar to Lamictal.  Patient stated she should get insurance through her husband's job in the coming week but no money to buy the medication currently and off 3 days.  Patient states she is still taking Celexa and agreed to call back once insurance started and could restart medication.  Requested patient contact this nurse back by 07/29/14 with an update as patient without symptoms currently but concern may have increased symptoms once going without medication again.  Agreed to inform Dr. Lolly MustacheArfeen patient currently out until likely sometime in the coming week but still taking Celexa.

## 2014-09-23 ENCOUNTER — Ambulatory Visit (HOSPITAL_COMMUNITY): Payer: 59 | Admitting: Psychiatry

## 2014-09-24 ENCOUNTER — Telehealth (HOSPITAL_COMMUNITY): Payer: Self-pay

## 2014-09-24 NOTE — Telephone Encounter (Signed)
Telephone message left for patient after she called and left a message she was having increased problems with controlling her anger and requested Dr. Lolly MustacheArfeen assist her with Xanax or Klonopin to help.  Patient no showed for evaluation on 09/23/14 so left patient a message with request to call back to get rescheduled for an evaluation as soon as possible.  Informed patient of need to come [facility] for [facility] or to one of the EDs if was an emergency and explained process.  Informed would send her request to Dr. Lolly MustacheArfeen but encouraged patient to make an appointment for ASAP.  Requested patient call back to assist.

## 2014-09-25 NOTE — Telephone Encounter (Signed)
Telephone call with patient after discussing with Dr. Lolly Mustache to ensure patient's safety. Patient stated "I am not in danger of hurting myself or anyone, but my anger has just gotten worse".  Patient stated she had forgotten appointment on 09/23/14 and did not feel she needed to obtain an emergency evaluation today at the ED or [facility].  Patient agreed with plan to reschedule appointment and given Dr. Sheela Stack first available on 10/22/14 at 10:15am.  Patient agreed with new appointment time and agreed if any symptoms worsened she would come in for an evaluation at the [facility] or one of the local EDs if needed.  Patient to call back if any other problems and need as well.

## 2014-10-09 ENCOUNTER — Telehealth (HOSPITAL_COMMUNITY): Payer: Self-pay

## 2014-10-09 NOTE — Telephone Encounter (Signed)
Medication management - Telephone call with patient after she left a message about some recent increased anxiety.  Ptient stated appointment from 10/22/14 was pushed back to 11/19/14 but she really needs something for mild panic attacks.  States she lost her GF and husband lost his job so they are having financial stressors and would like something prescribed for anxiety and will need at least one refill of other medications.  No current suicidal or homicidal ideations.

## 2014-10-10 NOTE — Telephone Encounter (Signed)
Met with Dr. Adele Schilder to discuss patient's reported increased symptoms of increased "mild panic attacks" and concern her appointment was pushed back until 11/19/14.  Left a message for patient, per Dr. Adele Schilder, he will need to see patient prior to starting her on any medications for anxiety.  Requested patient call this nurse back if still experiencing problems to assist her with possibly being rescheduled in the place of a cancellation.

## 2014-10-22 ENCOUNTER — Ambulatory Visit (HOSPITAL_COMMUNITY): Payer: Self-pay | Admitting: Psychiatry

## 2014-11-03 ENCOUNTER — Other Ambulatory Visit (HOSPITAL_COMMUNITY): Payer: Self-pay | Admitting: Psychiatry

## 2014-11-03 DIAGNOSIS — F3131 Bipolar disorder, current episode depressed, mild: Secondary | ICD-10-CM

## 2014-11-04 NOTE — Telephone Encounter (Signed)
One time refill of patient's prescribed Celexa and Lamictal authorized by Dr.Arfeen as patient was rescheduled from 10/22/14 to 11/19/14 due to Dr. Lolly MustacheArfeen being out of town. New one time orders e-scribed to [facility].

## 2014-11-19 ENCOUNTER — Ambulatory Visit (HOSPITAL_COMMUNITY): Payer: Self-pay | Admitting: Psychiatry

## 2016-05-30 ENCOUNTER — Ambulatory Visit (INDEPENDENT_AMBULATORY_CARE_PROVIDER_SITE_OTHER): Payer: Managed Care, Other (non HMO) | Admitting: Orthopedic Surgery

## 2016-05-30 VITALS — Ht 70.0 in | Wt 171.0 lb

## 2016-05-30 DIAGNOSIS — S93411A Sprain of calcaneofibular ligament of right ankle, initial encounter: Secondary | ICD-10-CM

## 2016-05-30 DIAGNOSIS — S92354A Nondisplaced fracture of fifth metatarsal bone, right foot, initial encounter for closed fracture: Secondary | ICD-10-CM

## 2016-05-30 NOTE — Progress Notes (Signed)
Office Visit Note   Patient: Shannon [name]           Date of Birth: 01-11-1986           MRN: [id number] Visit Date: 05/30/2016              Requested by: Joselyn Arrow, MD [address] [place], [place] [address] PCP: Lavonda Jumbo, MD  Chief Complaint  Patient presents with  . Right Foot - Injury, Pain    Fall down steps last week.    HPI: Going down steps this weekend and the right ankle rolled when she came down on it. Pt went to pcp and x rays were obtained and said that she has fractured the 5th MT. Pt is in regular shoe wear non weight bearing with crutches. The pt is keeping elevated and using Ibuprofen prn pain. The lateral and medial side of the ankle are swollen bruised and the pt states that there was swelling but this has resolved. Rodena Medin, RMA    Assessment & Plan: Visit Diagnoses:  1. Sprain of calcaneofibular ligament of right ankle, initial encounter   2. Closed nondisplaced fracture of fifth metatarsal bone of right foot, initial encounter     Plan: Patient has a nondisplaced fracture base of the fifth metatarsal right foot with a sprain of the anterior talofibular ligament. We'll place her fracture boot weightbearing as tolerated.  Three-view radiographs of the right foot at follow-up.  Follow-Up Instructions: Return in about 3 weeks (around 06/20/2016).   Ortho Exam Examination patient is alert oriented no adenopathy well-dressed normal affect normal respiratory effort she ambulates on crutches nonweightbearing on the right. She has ecchymosis and bruising involving the entire dorsal lateral aspect of the right foot. She has good pulses anterior drawer of the ankle reproduces pain with about 3 mm of anterior displacement. She is tender to palpation over the anterior talofibular ligament. She is also exquisitely tender to palpation of the base of the fifth metatarsal. Her outside radiographs were reviewed which shows no evidence of a displaced ankle  fracture she does have a small nondisplaced fracture of the base of the fifth metatarsal in the metaphyseal bone.  Imaging: No results found.  Orders:  No orders of the defined types were placed in this encounter.  No orders of the defined types were placed in this encounter.    Procedures: No procedures performed  Clinical Data: No additional findings.  Subjective: Review of Systems  Objective: Vital Signs: Ht 5\' 10"  (1.778 m)   Wt 171 lb (77.6 kg)   BMI 24.54 kg/m   Specialty Comments:  No specialty comments available.  PMFS History: Patient Active Problem List   Diagnosis Date Noted  . Sprain of calcaneofibular ligament of right ankle 05/30/2016  . Closed nondisplaced fracture of fifth right metatarsal bone 05/30/2016  . Depression, major, recurrent, moderate (HCC) 05/06/2014  . Anxiety state, unspecified 07/18/2012  . Neck pain 07/18/2012  . Jaw pain 07/18/2012   Past Medical History:  Diagnosis Date  . Anxiety   . Clenching of teeth    states due to stress   . Degenerative arthritis of right shoulder region 10/2013  . Depression   . 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 Disorders of bursae and tendons in shoulder region, unspecified 10/2013   right  . Recent upper respiratory tract infection 10/22/2013   states still has some congestion    Family History  Problem Relation Age of  Onset  . Anxiety disorder Sister   . Anxiety disorder Brother   . Bipolar disorder Mother   . Heart disease Maternal Grandfather   . Hypertension Maternal Grandfather   . Cancer Maternal Grandfather   . Depression Sister   . Anxiety disorder Father     Past Surgical History:  Procedure Laterality Date  . SHOULDER ARTHROSCOPY WITH DISTAL CLAVICLE RESECTION Right 10/24/2013   Procedure: RIGHT SHOULDER DEBRIDEMENT EXTENSIVE, DISTAL CLAVICULECTOMY, DECOMPRESSION SUBACROMIAL PARTIAL ACROMIOPLASTY WITH DEBRIDEMENT OF LABRIUM;  Surgeon: Loreta Aveaniel F Murphy, MD;   Location: [facility];  Service: Orthopedics;  Laterality: Right;  . TONSILLECTOMY    . TUBAL LIGATION  10/19/2011   Procedure: POST PARTUM TUBAL LIGATION;  Surgeon: Dorien Chihuahuaara J. Richardson Doppole, MD;  Location: [facility];  Service: Gynecology;  Laterality: Bilateral;  . WISDOM TOOTH EXTRACTION     Social History   Occupational History  . 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 Smoking status: Former Smoker    Quit date: 10/13/2009  . Smokeless tobacco: Never Used  . Alcohol use No  . Drug use: Yes    Types: Marijuana  . Sexual activity: Yes    Partners: Male    Birth control/ protection: Surgical

## 2016-06-17 NOTE — Progress Notes (Signed)
Office Visit Note   Patient: Shannon [name]           Date of Birth: [birth year]           MRN: [id number] Visit Date: 06/20/2016              Requested by: Joselyn Arrow, MD [address] [place], [place] [address] PCP: Lavonda Jumbo, MD  No chief complaint on file.   HPI: Patient has a nondisplaced fracture base of the fifth metatarsal right foot with a sprain of the anterior talofibular ligament. She is full weight bearing in a fracture boot. The pt states that she is "hard headed" and has walked in a regular tennis shoe a few times over the past week full weight bearing and that after 20 min or so of walking she would experience pain. Patient has no other questions or concerns today. Autumn Hinda Lenis, RMA  Doing great. Has tried walking in regular shoe wear. States this feels ok for the first 2 hours, then hurts. No complaints of pain or swelling. Not taking anything for pain.   Assessment & Plan: Visit Diagnoses: No diagnosis found.  Plan: Continue WTAB in fracture boot. May advance to a stiff soled shoe. If pain return to boot. Follow up in 3 more weeks.   Follow-Up Instructions: No Follow-up on file.   Physical Exam  Constitutional: Appears well-developed.  Head: Normocephalic.  Eyes: EOM are normal.  Neck: Normal range of motion.  Cardiovascular: Normal rate.   Pulmonary/Chest: Effort normal.  Neurological: Is alert.  Skin: Skin is warm.  Psychiatric: Has a normal mood and affect.  Right Ankle Exam  Swelling: none  Tenderness  The patient is experiencing no tenderness.  Range of Motion  The patient has normal right ankle ROM.  Muscle Strength  The patient has normal right ankle strength.  Tests  Anterior drawer: negative Varus tilt: negative   Comments:  Minimal tenderness over base of 5th mt       Imaging: No results found.  Labs: Lab Results  Component Value Date   LABURIC 6.0 03/24/2010   REPTSTATUS 08/16/2011 FINAL 08/15/2011   CULT  NO GROWTH 08/15/2011    Orders:  No orders of the defined types were placed in this encounter.  No orders of the defined types were placed in this encounter.    Procedures: No procedures performed  Clinical Data: No additional findings.  Subjective: Review of Systems  Constitutional: Negative for chills and fever.  Musculoskeletal: Negative for arthralgias, gait problem and joint swelling.    Objective: Vital Signs: There were no vitals taken for this visit.  Specialty Comments:  No specialty comments available.  PMFS History: Patient Active Problem List   Diagnosis Date Noted  . Sprain of calcaneofibular ligament of right ankle 05/30/2016  . Closed nondisplaced fracture of fifth right metatarsal bone 05/30/2016  . Depression, major, recurrent, moderate (HCC) 05/06/2014  . Anxiety state, unspecified 07/18/2012  . Neck pain 07/18/2012  . Jaw pain 07/18/2012   Past Medical History:  Diagnosis Date  . Anxiety   . Clenching of teeth    states due to stress   . Degenerative arthritis of right shoulder region 10/2013  . Depression   . 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 Disorders of bursae and tendons in shoulder region, unspecified 10/2013   right  . Recent upper respiratory tract infection 10/22/2013   states still has some congestion    Family History  Problem Relation Age of Onset  . Anxiety disorder Sister   . Anxiety disorder Brother   . Bipolar disorder Mother   . Heart disease Maternal Grandfather   . Hypertension Maternal Grandfather   . Cancer Maternal Grandfather   . Depression Sister   . Anxiety disorder Father     Past Surgical History:  Procedure Laterality Date  . SHOULDER ARTHROSCOPY WITH DISTAL CLAVICLE RESECTION Right 10/24/2013   Procedure: RIGHT SHOULDER DEBRIDEMENT EXTENSIVE, DISTAL CLAVICULECTOMY, DECOMPRESSION SUBACROMIAL PARTIAL ACROMIOPLASTY WITH DEBRIDEMENT OF LABRIUM;  Surgeon: Loreta Aveaniel F Murphy, MD;  Location:   S[facility]ERY [facility];  Service: Orthopedics;  Laterality: Right;  . TONSILLECTOMY    . TUBAL LIGATION  10/19/2011   Procedure: POST PARTUM TUBAL LIGATION;  Surgeon: Dorien Chihuahuaara J. Richardson Doppole, MD;  Location: [facility];  Service: Gynecology;  Laterality: Bilateral;  . WISDOM TOOTH EXTRACTION     Social History   Occupational History  . 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 Smoking status: Former Smoker    Quit date: 10/13/2009  . Smokeless tobacco: Never Used  . Alcohol use No  . Drug use: Yes    Types: Marijuana  . Sexual activity: Yes    Partners: Male    Birth control/ protection: Surgical

## 2016-06-20 ENCOUNTER — Encounter (INDEPENDENT_AMBULATORY_CARE_PROVIDER_SITE_OTHER): Payer: Self-pay | Admitting: Orthopedic Surgery

## 2016-06-20 ENCOUNTER — Ambulatory Visit (INDEPENDENT_AMBULATORY_CARE_PROVIDER_SITE_OTHER): Payer: Managed Care, Other (non HMO) | Admitting: Family

## 2016-06-20 ENCOUNTER — Ambulatory Visit (INDEPENDENT_AMBULATORY_CARE_PROVIDER_SITE_OTHER): Payer: Self-pay

## 2016-06-20 VITALS — Ht 70.0 in | Wt 171.0 lb

## 2016-06-20 DIAGNOSIS — S93411A Sprain of calcaneofibular ligament of right ankle, initial encounter: Secondary | ICD-10-CM | POA: Diagnosis not present

## 2016-06-20 DIAGNOSIS — M79671 Pain in right foot: Secondary | ICD-10-CM | POA: Diagnosis not present

## 2016-06-20 DIAGNOSIS — S92354A Nondisplaced fracture of fifth metatarsal bone, right foot, initial encounter for closed fracture: Secondary | ICD-10-CM

## 2016-07-11 ENCOUNTER — Ambulatory Visit (INDEPENDENT_AMBULATORY_CARE_PROVIDER_SITE_OTHER): Payer: Managed Care, Other (non HMO) | Admitting: Orthopedic Surgery

## 2018-02-15 ENCOUNTER — Emergency Department (HOSPITAL_COMMUNITY)
Admission: EM | Admit: 2018-02-15 | Discharge: 2018-02-15 | Disposition: A | Discharge status: Home / Self Care | Payer: BLUE CROSS/BLUE SHIELD | Attending: Emergency Medicine | Admitting: Emergency Medicine

## 2018-02-15 ENCOUNTER — Encounter (HOSPITAL_COMMUNITY): Payer: Self-pay | Admitting: Emergency Medicine

## 2018-02-15 DIAGNOSIS — Z87891 Personal history of nicotine dependence: Secondary | ICD-10-CM | POA: Insufficient documentation

## 2018-02-15 DIAGNOSIS — M542 Cervicalgia: Secondary | ICD-10-CM | POA: Insufficient documentation

## 2018-02-15 DIAGNOSIS — M25511 Pain in right shoulder: Secondary | ICD-10-CM | POA: Diagnosis present

## 2018-02-15 DIAGNOSIS — M79601 Pain in right arm: Secondary | ICD-10-CM

## 2018-02-15 DIAGNOSIS — M62838 Other muscle spasm: Secondary | ICD-10-CM | POA: Diagnosis not present

## 2018-02-15 DIAGNOSIS — Z79899 Other long term (current) drug therapy: Secondary | ICD-10-CM | POA: Diagnosis not present

## 2018-02-15 MED ORDER — KETOROLAC TROMETHAMINE 15 MG/ML IJ SOLN: 15.0000 mg | Freq: Once | INTRAMUSCULAR | Status: DC

## 2018-02-15 MED ORDER — METHOCARBAMOL 500 MG PO TABS: 500.0000 mg | ORAL_TABLET | Freq: Two times a day (BID) | ORAL | 0 refills | Status: DC

## 2018-02-15 MED ORDER — KETOROLAC TROMETHAMINE 15 MG/ML IJ SOLN
15.0000 mg | Freq: Once | INTRAMUSCULAR | Status: AC
  Administered 2018-02-15: 15 mg via INTRAMUSCULAR
  Filled 2018-02-15: qty 1

## 2018-02-15 MED ORDER — PREDNISONE 10 MG PO TABS: 40.0000 mg | ORAL_TABLET | Freq: Every day | ORAL | 0 refills | Status: AC

## 2018-02-15 MED ORDER — LIDOCAINE 5 % EX PTCH
1.0000 | MEDICATED_PATCH | CUTANEOUS | Status: DC
  Administered 2018-02-15: 1 via TRANSDERMAL
  Filled 2018-02-15: qty 1

## 2018-02-15 MED ORDER — LIDOCAINE 5 % EX PTCH: 1.0000 | MEDICATED_PATCH | CUTANEOUS | 0 refills | Status: DC

## 2018-02-15 NOTE — ED Notes (Signed)
ED Provider at bedside. 

## 2018-02-15 NOTE — ED Provider Notes (Signed)
[facility]-EMERGENCY DEPT Provider Note   CSN: [id number] Arrival date & time: 02/15/18  1039     History   Chief Complaint Chief Complaint  Patient presents with  . Neck Pain  . Shoulder Pain  . Arm Pain    HPI Shannon [name] is a 32 y.o. female presenting for right-sided neck pain and right shoulder pain that have been present for the past 4 to 5 years.  Patient states that she has had intermittent dull/throbbing pain that is moderate in intensity to the right side of her neck, right trapezius muscle and radiating down the right shoulder to the right upper arm for multiple years now.  Patient states that she has been evaluated by orthopedics for this problem and informed she may had a bulging disc in her neck. (Unable to access these records) patient states that she has received injections and arthroscopic surgery of her right shoulder for her pain.  Patient states that none of these treatments at the orthopedic doctor helped with her pain and that she has been dealing with the same pain for multiple years.  Patient states that she has not seen the orthopedic doctors in multiple years and has not received injections in multiple years.  Patient denies change in characteristic of her pain however states it has become worse over the past 2 days due to "colder weather".  Patient denies injury/trauma or fall, fever, bowel/bladder incontinence, weight loss, IV drug use, cancer history, cough, headache or any and all other pain.  Patient states that she feels an occasional tingling to the tips of the fingers of her right hand that have been intermittent with the pain over the past 4 years.  Denies extremity weakness.  Denying tingling during examination today, states this only happens occasionally.  HPI  Past Medical History:  Diagnosis Date  . Anxiety   . Clenching of teeth    states due to stress   . Degenerative arthritis of right shoulder region 10/2013  .  Depression   . 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 Disorders of bursae and tendons in shoulder region, unspecified 10/2013   right  . Recent upper respiratory tract infection 10/22/2013   states still has some congestion    Patient Active Problem List   Diagnosis Date Noted  . Sprain of calcaneofibular ligament of right ankle 05/30/2016  . Closed nondisplaced fracture of fifth right metatarsal bone 05/30/2016  . Depression, major, recurrent, moderate (HCC) 05/06/2014  . Anxiety state, unspecified 07/18/2012  . Neck pain 07/18/2012  . Jaw pain 07/18/2012    Past Surgical History:  Procedure Laterality Date  . SHOULDER ARTHROSCOPY WITH DISTAL CLAVICLE RESECTION Right 10/24/2013   Procedure: RIGHT SHOULDER DEBRIDEMENT EXTENSIVE, DISTAL CLAVICULECTOMY, DECOMPRESSION SUBACROMIAL PARTIAL ACROMIOPLASTY WITH DEBRIDEMENT OF LABRIUM;  Surgeon: Loreta Ave, MD;  Location: [facility];  Service: Orthopedics;  Laterality: Right;  . TONSILLECTOMY    . TUBAL LIGATION  10/19/2011   Procedure: POST PARTUM TUBAL LIGATION;  Surgeon: Dorien Chihuahua. Richardson Dopp, MD;  Location: [facility];  Service: Gynecology;  Laterality: Bilateral;  . WISDOM TOOTH EXTRACTION       OB History    Gravida  3   Para  2   Term  2   Preterm      AB  1   Living  2     SAB  1   TAB  0   Ectopic  0   Multiple  0   Live Births  2            Home Medications    Prior to Admission medications   Medication Sig Start Date End Date Taking? Authorizing Provider  citalopram (CELEXA) 40 MG tablet TAKE ONE TABLET BY MOUTH ONCE DAILY 11/04/14   Arfeen, Phillips Grout, MD  lamoTRIgine (LAMICTAL) 25 MG tablet TAKE TWO TABLETS BY MOUTH ONCE DAILY 11/04/14   Arfeen, Phillips Grout, MD  lidocaine (LIDODERM) 5 % Place 1 patch onto the skin daily. Remove & Discard patch within 12 hours or as directed by MD 02/15/18   Bill Salinas, PA-C  methocarbamol (ROBAXIN) 500 MG tablet Take 1 tablet (500 mg total)  by mouth 2 (two) times daily. 02/15/18   Harlene Salts A, PA-C  predniSONE (DELTASONE) 10 MG tablet Take 4 tablets (40 mg total) by mouth daily for 5 days. 02/15/18 02/20/18  Bill Salinas, PA-C    Family History Family History  Problem Relation Age of Onset  . Bipolar disorder Mother   . Heart disease Maternal Grandfather   . Hypertension Maternal Grandfather   . Cancer Maternal Grandfather   . Depression Sister   . Anxiety disorder Father   . Anxiety disorder Sister   . Anxiety disorder Brother     Social History Social History   Tobacco Use  . Smoking status: Former Smoker    Last attempt to quit: 10/13/2009    Years since quitting: 8.3  . Smokeless tobacco: Never Used  Substance Use Topics  . Alcohol use: No    Alcohol/week: 0.0 standard drinks  . Drug use: Yes    Types: Marijuana     Allergies   Penicillins and Codeine   Review of Systems Review of Systems  Constitutional: Negative.  Negative for chills, fever and unexpected weight change.  Musculoskeletal: Positive for arthralgias and neck pain. Negative for back pain and joint swelling.  Skin: Negative.  Negative for color change and rash.  Neurological: Negative for dizziness, weakness and headaches.       Tingling to tips of right fingers occasionally, none today  Denies saddle area paresthesias Denies bowel/bladder incontinence   Physical Exam Updated Vital Signs BP (!) 126/91   Pulse 70   Temp 98.3 F (36.8 C) (Oral)   Resp 18   Ht 5\' 10"  (1.778 m)   Wt 74.2 kg   LMP 02/07/2018   SpO2 99%   BMI 23.47 kg/m   Physical Exam  Constitutional: She is oriented to person, place, and time. She appears well-developed and well-nourished. No distress.  HENT:  Head: Normocephalic and atraumatic.  Right Ear: Hearing, tympanic membrane, external ear and ear canal normal.  Left Ear: Hearing, tympanic membrane, external ear and ear canal normal.  Nose: Nose normal.  Mouth/Throat: Uvula is midline,  oropharynx is clear and moist and mucous membranes are normal.  Eyes: Pupils are equal, round, and reactive to light. Conjunctivae and EOM are normal.  Neck: Trachea normal, normal range of motion and phonation normal. Neck supple. Muscular tenderness present. No tracheal tenderness and no spinous process tenderness present. No neck rigidity. No tracheal deviation, no edema, no erythema and normal range of motion present.  Cardiovascular:  Pulses:      Radial pulses are 2+ on the right side, and 2+ on the left side.  Pulmonary/Chest: Effort normal and breath sounds normal. No respiratory distress.  Abdominal: Soft. There is no tenderness. There is no rebound and no guarding.  Musculoskeletal:  Normal range of motion.       Right shoulder: She exhibits tenderness. She exhibits normal range of motion, no bony tenderness, no swelling, no crepitus and no deformity.       Left shoulder: Normal.       Cervical back: She exhibits tenderness and spasm. She exhibits normal range of motion, no bony tenderness, no swelling and no deformity.       Back:  No midline spinal tenderness to palpation.  No crepitus step-off or deformity of the spine noted.  Patient does have right-sided cervical paraspinal muscular tenderness to palpation as well right trapezius muscle tenderness and spasm.  Cervical Spine: Appearance normal. No obvious bony deformity. No skin swelling, erythema, heat, fluctuance or break of the skin. No TTP over the cervical spinous processes. Right sided paraspinal tenderness. No step-offs. Patient is able to actively rotate their neck 45 degrees left and right voluntarily with some increased right sided pain with turning head to right. Patient is able to flex and extend the neck without pain.  No exacerbation of arm pain with neck movement.  No tingling of extremities during examination.  Right Shoulder: Appearance normal. No obvious bony deformity. No skin swelling, erythema, heat, fluctuance  or break of the skin. No clavicular TTP. TTP diffusely over deltoid. Active and passive flexion, extension, abduction, adduction, and internal/external rotation intact with mild pain but without crepitus. Strength for flexion, extension, abduction, adduction, and internal/external rotation intact and appropriate for age.   Right Elbow: Appearance normal. No obvious bony deformity. No skin swelling, erythema, heat, fluctuance or break of the skin. No TTP over joint. Active flexion, extension, supination and pronation full and intact without pain. Strength able and appropriate for age for flexion and extension.  Radial Pulse 2+. Cap refill <2 seconds. SILT for M/U/R distributions. Compartments soft.    Neurological: She is alert and oriented to person, place, and time. She has normal strength. She displays a negative Romberg sign. GCS eye subscore is 4. GCS verbal subscore is 5. GCS motor subscore is 6.  Mental Status: Alert, oriented, thought content appropriate, able to give a coherent history. Speech fluent without evidence of aphasia. Able to follow 2 step commands without difficulty. Cranial Nerves: II: Peripheral visual fields grossly normal, pupils equal, round, reactive to light III,IV, VI: ptosis not present, extra-ocular motions intact bilaterally V,VII: smile symmetric, eyebrows raise symmetric, facial light touch sensation equal VIII: hearing grossly normal to voice X: uvula elevates symmetrically XI: bilateral shoulder shrug symmetric and strong XII: midline tongue extension without fassiculations Motor: Normal tone. 5/5 strength in upper and lower extremities bilaterally including strong and equal grip strength and dorsiflexion/plantar flexion Sensory: Sensation intact to light touch in all extremities.Negative Romberg.  Cerebellar: normal finger-to-nose with bilateral upper extremities. Normal heel-to -shin balance bilaterally of the lower extremity. No pronator drift.    Gait: normal gait and balance CV: distal pulses palpable throughout  Skin: Skin is warm and dry. Capillary refill takes less than 2 seconds.  Psychiatric: She has a normal mood and affect. Her behavior is normal.   ED Treatments / Results  Labs (all labs ordered are listed, but only abnormal results are displayed) Labs Reviewed - No data to display  EKG None  Radiology No results found.  Procedures Procedures (including critical care time)  Medications Ordered in ED Medications  lidocaine (LIDODERM) 5 % 1 patch (1 patch Transdermal Patch Applied 02/15/18 1435)  ketorolac (TORADOL) 15 MG/ML injection 15 mg (15 mg Intramuscular  Given 02/15/18 1312)     Initial Impression / Assessment and Plan / ED Course  I have reviewed the triage vital signs and the nursing notes.  Pertinent labs & imaging results that were available during my care of the patient were reviewed by me and considered in my medical decision making (see chart for details).  Clinical Course as of Feb 15 1446  Thu Feb 15, 2018  1304 Discussed w/ Dr. Estell Harpin; agrees with discharge with robaxin/prednisone and follow-up.   [BM]    Clinical Course User Index [BM] Bill Salinas, PA-C   Anasophia Pecor is a 32 y.o. female who presents to ED for right sided neck pain, right shoulder pain for the past 4 years without change or injury. On exam, patient is with negative NEXUS (no midline spinal tenderness, no focal neuro deficits, no evidence of intoxication is present, normal level of alertness, no painful distracting injury).  No neurological deficits and normal neuro exam.  Patient denies loss of bowel/bladder control or saddle area paresthesias.  No concern for cauda equina.  No fever, night sweats, weight loss, h/o cancer, or IVDU.   Signs and symptoms most likely due to right trapezius muscle spasm.  Symptomatic home care instructions and return precautions discussed. Strongly encouraged to follow up with  primary-care provider and orthopedics.  Patient given 15 mg IM Toradol for symptomatic relief. Denies CKD or gastric ulcers/bleeding. Denies pregnancy (tubal ligation). Robaxin 500mg  BID prescribed. Patient informed to avoid driving or operating heavy machinery while taking muscle relaxer. Patient also prescribed short burst of prednisone, possible peripheral nerve involvement due to intermittent right finger tingling.  Patient without weakness or neurological deficit on examination.  Likely due to trapezius muscle spasm.  Do not suspect central nervous system involvement or brachial plexus injury.  Patient denies history of diabetes.  Patient's case discussed with Dr. Estell Harpin who agrees with treatment plan and orthopedic/PCP outpatient follow-up.  Patient afebrile, not tachycardic, not hypotensive well-appearing and in no acute distress.  Do not suspect infection or neurovascular compromise at this time.  At this time there does not appear to be any evidence of an acute emergency medical condition and the patient appears stable for discharge with appropriate outpatient follow up. Diagnosis was discussed with patient who verbalizes understanding of care plan and is agreeable to discharge. I have discussed return precautions with patient who verbalizes understanding of return precautions. Patient strongly encouraged to follow-up with their PCP. All questions answered.   Note: Portions of this report may have been transcribed using voice recognition software. Every effort was made to ensure accuracy; however, inadvertent computerized transcription errors may still be present. Final Clinical Impressions(s) / ED Diagnoses   Final diagnoses:  Right arm pain  Trapezius muscle spasm  Neck pain on right side    ED Discharge Orders         Ordered    lidocaine (LIDODERM) 5 %  Every 24 hours     02/15/18 1434    predniSONE (DELTASONE) 10 MG tablet  Daily     02/15/18 1434    methocarbamol (ROBAXIN)  500 MG tablet  2 times daily     02/15/18 [address] 02/15/18 1807    Bethann Berkshire, MD 02/17/18 0740

## 2018-02-15 NOTE — Discharge Instructions (Signed)
Please return to the Emergency Department for any new or worsening symptoms or if your symptoms do not improve. Please be sure to follow up with your Primary Care Physician as soon as possible regarding your visit today. If you do not have a Primary Doctor please use the resources below to establish one. You may use the medications prednisone and Robaxin as prescribed to help with your pain.  Do not drive or operate heavy machinery while taking Robaxin because it can make you drowsy. You may use the Lidoderm patch as prescribed to help with your pain.  You may also use heating pads/ice packs to help with your pain.  You may use over-the-counter Tylenol to help with your pain, only use these as directed on the packaging. Please follow-up with the orthopedic specialist on your discharge paperwork for further evaluation of your neck/shoulder pain.  Contact a health care provider if: Your pain and other symptoms get worse. Your pain medicine is not helping. Your pain has not improved after a few weeks of home care. You have a fever. Get help right away if: You have severe pain, weakness, or numbness. You have difficulty with bladder or bowel control. Contact a health care provider if: Your muscle pain gets worse and medicines do not help. You have muscle pain that lasts longer than 3 days. You have a rash or fever along with muscle pain. You have muscle pain after a tick bite. You have muscle pain while working out, even though you are in good physical condition. You have redness, soreness, or swelling along with muscle pain. You have muscle pain after starting a new medicine or changing the dose of a medicine. Get help right away if: You have trouble breathing. You have trouble swallowing. You have muscle pain along with a stiff neck, fever, and vomiting. You have severe muscle weakness or cannot move part of your body.  Do not take your medicine if  develop an itchy rash, swelling in your  mouth or lips, or difficulty breathing.   RESOURCE GUIDE  Chronic Pain Problems: Contact [facility]  [phone] Patients need to be referred by their primary care doctor.  Insufficient Money for Medicine: Contact United Way:  call "211" or [facility] 503-163-7839.  No Primary Care Doctor: Call [facility]  [phone] - can help you locate a primary care doctor that  accepts your insurance, provides certain services, etc. Physician Referral [phone]  Agencies that provide inexpensive medical care: [facility]  [phone] [facility]  [phone] [facility]  [phone] [facility]  [phone] [facility]  [phone] [facility]  [phone]  Medicaid-accepting [facility] Providers: [facility]- [address]  [phone], Mon-Fri 9am-7pm, Sat 9am-1pm [facility]- [address] [place], Suite [place]  [phone] [facility]- [address], Suite [place]  [phone] [facility]- [address]  [phone] Renaye Rakers- [address] [place], [address], [phone]  Only accepts [place] patients after they have their name  applied to their card  Self Pay (no insurance) in [facility]: Sickle Cell Patients: Dr Willey Blade, [facility] Internal Medicine  [address] [place], [phone] [facility]- [address] [place]  [phone]       [facility] [place]- [address]       -  [facility]- see information above (Speak to [employer] if you do not have insurance)       -  [facility]- [address] [place], [phone]       -  [facility]- [address],  [phone]       -  [facility]- [address] [phone]       -  Dr Julio Sicks-  [address], [place], [phone]       -  [facility]- [address], [phone]       -  [facility]- [address], [phone], also [address] [phone]       -    [facility] [address] [place], [phone], 1st & 3rd Saturday   every month, 10am-1pm  1) Find a Doctor and Pay Out of Pocket Although you won't have to find out who is covered by your insurance plan, it is a good idea to ask around and get recommendations. You will then need to call the office and see if the doctor you have chosen will accept you as a new patient and what types of options they offer for patients who are self-pay. Some doctors offer discounts or will set up payment plans for their patients who do not have insurance, but you will need to ask so you aren't surprised when you get to your appointment.  2) Contact Your Local Health Department Not all health departments have doctors that can see patients for sick visits, but many do, so it is worth a call to see if yours does. If you don't know where your local health department is, you can check in your phone book. The CDC also has a tool to help you locate your state's health department, and many state websites also have listings of all of their local health departments.  3) Find a Walk-in Clinic If your illness is not likely to be very severe or complicated, you may want to try a walk in clinic. These are popping up all over the country in pharmacies, drugstores, and shopping centers. They're usually staffed by nurse practitioners or physician assistants that have been trained to treat common illnesses and complaints. They're usually fairly quick and inexpensive. However, if you have serious medical issues or chronic medical problems, these are probably not your best option  STD Testing [facility] Department of [facility], STD Clinic, [address] [place], phone [phone] or [phone].  Monday - Friday, call for an appointment. [facility]  Department of [employer], STD Clinic, [address], [place], phone [phone] or [phone].  Monday - Friday, call for an appointment.  Abuse/Neglect: [facility] Child Abuse Hotline 8454070194 [facility] Child Abuse Hotline 906-066-7825 (After Hours)  Emergency Shelter:  [facility] 917-694-8822  Maternity Homes: [facility] 720-156-7154 [facility] [phone]  MRSA Hotline #:   702-497-0811  [facility] Resources  [facility]  United Way [facility] Dept. [address]                 [address]         [address] [place] [address]  [address]  Cristobal Goldmann Phone:  [phone]                                  Phone:  [phone]                   Phone:  [phone]  [facility], [phone] [facility] [place]- [phone]       -     [facility] in [place], [address]                                  [phone], [facility] Child Abuse Hotline 4756675011 or (662) 454-0114 (After Hours)   Behavioral Health Services  Substance Abuse Resources: [facility] Drug Services  [phone] [facility] [phone] [facility] [place] [phone] Floydene Flock (515)[phone] Residential & Outpatient Substance Abuse Program  [phone]  Psychological Services: [facility]  7652737507 [facility] Services  [phone] [facility], [phone] [place]. [address], [place], ACCESS LINE: [phone] or [phone], [email or web address]  Dental Assistance  If unable to pay or uninsured, contact:  [facility] or [facility] to become qualified for the adult dental clinic.  Patients with Medicaid:  [facility] [phone] [address], [phone] [address] [phone]  If unable to pay, or uninsured, contact [facility] (515)[phone]) or [facility] Department [phone] in [place], [phone] in [facility]) to become qualified for the adult dental clinic   Other Low-Cost Community Dental Services: [facility]- [address] [place], [place], [place], [address], [phone], [address]. [phone], 2nd and 4th Thursday of the month at 6:30am.  10 clients each day by appointment, can sometimes see walk-in patients if someone does not show for an appointment. [facility]- [address] [place], [place], [address], [phone] [facility] [address] [place], [place], [address], [phone] [facility] Health Department- [phone] [facility] Health Department- [phone] [facility] [phone]

## 2018-02-15 NOTE — ED Notes (Signed)
PA made aware that medication did not help pts pain.

## 2018-02-15 NOTE — ED Triage Notes (Signed)
Pt reports right neck pains that radiate down left arm x 4-5 years. Reports had surgeries and injections but still having pains esp that past 2 days.

## 2018-02-15 NOTE — ED Notes (Signed)
Bed: WTR5 Expected date:  Expected time:  Means of arrival:  Comments: 

## 2018-02-19 ENCOUNTER — Other Ambulatory Visit: Payer: Self-pay | Admitting: Family Medicine

## 2018-02-19 DIAGNOSIS — M5412 Radiculopathy, cervical region: Secondary | ICD-10-CM

## 2018-03-04 ENCOUNTER — Ambulatory Visit
Admission: RE | Admit: 2018-03-04 | Discharge: 2018-03-04 | Disposition: A | Discharge status: Home / Self Care | Payer: BLUE CROSS/BLUE SHIELD | Source: Ambulatory Visit | Attending: Family Medicine | Admitting: Family Medicine

## 2018-03-04 DIAGNOSIS — M5412 Radiculopathy, cervical region: Secondary | ICD-10-CM

## 2018-04-26 ENCOUNTER — Other Ambulatory Visit: Payer: Self-pay

## 2018-04-26 ENCOUNTER — Emergency Department (HOSPITAL_COMMUNITY)
Admission: EM | Admit: 2018-04-26 | Discharge: 2018-04-26 | Disposition: A | Discharge status: Other Acute Inpatient Hospital | Payer: BLUE CROSS/BLUE SHIELD | Source: Home / Self Care | Attending: Emergency Medicine | Admitting: Emergency Medicine

## 2018-04-26 ENCOUNTER — Encounter (HOSPITAL_COMMUNITY): Payer: Self-pay | Admitting: Emergency Medicine

## 2018-04-26 ENCOUNTER — Inpatient Hospital Stay (HOSPITAL_COMMUNITY)
Admission: AD | Admit: 2018-04-26 | Discharge: 2018-05-01 | DRG: 885 | Disposition: A | Discharge status: Home / Self Care | Payer: BLUE CROSS/BLUE SHIELD | Source: Intra-hospital | Attending: Psychiatry | Admitting: Psychiatry

## 2018-04-26 ENCOUNTER — Encounter (HOSPITAL_COMMUNITY): Payer: Self-pay | Admitting: *Deleted

## 2018-04-26 DIAGNOSIS — Z87891 Personal history of nicotine dependence: Secondary | ICD-10-CM | POA: Diagnosis not present

## 2018-04-26 DIAGNOSIS — Z720 Tobacco use: Secondary | ICD-10-CM

## 2018-04-26 DIAGNOSIS — Z885 Allergy status to narcotic agent status: Secondary | ICD-10-CM

## 2018-04-26 DIAGNOSIS — Z880 Allergy status to penicillin: Secondary | ICD-10-CM

## 2018-04-26 DIAGNOSIS — Z915 Personal history of self-harm: Secondary | ICD-10-CM | POA: Diagnosis not present

## 2018-04-26 DIAGNOSIS — Z818 Family history of other mental and behavioral disorders: Secondary | ICD-10-CM | POA: Diagnosis not present

## 2018-04-26 DIAGNOSIS — Y92008 Other place in unspecified non-institutional (private) residence as the place of occurrence of the external cause: Secondary | ICD-10-CM

## 2018-04-26 DIAGNOSIS — G4710 Hypersomnia, unspecified: Secondary | ICD-10-CM | POA: Diagnosis present

## 2018-04-26 DIAGNOSIS — T5892XA Toxic effect of carbon monoxide from unspecified source, intentional self-harm, initial encounter: Secondary | ICD-10-CM | POA: Diagnosis present

## 2018-04-26 DIAGNOSIS — F419 Anxiety disorder, unspecified: Secondary | ICD-10-CM | POA: Diagnosis not present

## 2018-04-26 DIAGNOSIS — F332 Major depressive disorder, recurrent severe without psychotic features: Secondary | ICD-10-CM

## 2018-04-26 DIAGNOSIS — R45851 Suicidal ideations: Secondary | ICD-10-CM

## 2018-04-26 DIAGNOSIS — Z79899 Other long term (current) drug therapy: Secondary | ICD-10-CM | POA: Insufficient documentation

## 2018-04-26 DIAGNOSIS — F4000 Agoraphobia, unspecified: Secondary | ICD-10-CM | POA: Diagnosis present

## 2018-04-26 DIAGNOSIS — G4700 Insomnia, unspecified: Secondary | ICD-10-CM | POA: Diagnosis not present

## 2018-04-26 DIAGNOSIS — F410 Panic disorder [episodic paroxysmal anxiety] without agoraphobia: Secondary | ICD-10-CM | POA: Diagnosis present

## 2018-04-26 LAB — CBC WITH DIFFERENTIAL/PLATELET
Abs Immature Granulocytes: 0.02 10*3/uL (ref 0.00–0.07)
Basophils Absolute: 0 10*3/uL (ref 0.0–0.1)
Basophils Relative: 0 %
EOS ABS: 0.1 10*3/uL (ref 0.0–0.5)
EOS PCT: 1 %
HEMATOCRIT: 39.8 % (ref 36.0–46.0)
Hemoglobin: 12.8 g/dL (ref 12.0–15.0)
Immature Granulocytes: 0 %
LYMPHS ABS: 2.3 10*3/uL (ref 0.7–4.0)
LYMPHS PCT: 26 %
MCH: 30.8 pg (ref 26.0–34.0)
MCHC: 32.2 g/dL (ref 30.0–36.0)
MCV: 95.9 fL (ref 80.0–100.0)
MONO ABS: 0.7 10*3/uL (ref 0.1–1.0)
Monocytes Relative: 8 %
Neutro Abs: 5.7 10*3/uL (ref 1.7–7.7)
Neutrophils Relative %: 65 %
Platelets: 287 10*3/uL (ref 150–400)
RBC: 4.15 MIL/uL (ref 3.87–5.11)
RDW: 12.1 % (ref 11.5–15.5)
WBC: 8.8 10*3/uL (ref 4.0–10.5)
nRBC: 0 % (ref 0.0–0.2)

## 2018-04-26 LAB — RAPID URINE DRUG SCREEN, HOSP PERFORMED
Amphetamines: NOT DETECTED
BARBITURATES: NOT DETECTED
Benzodiazepines: NOT DETECTED
Cocaine: NOT DETECTED
Opiates: NOT DETECTED
Tetrahydrocannabinol: POSITIVE — AB

## 2018-04-26 LAB — COMPREHENSIVE METABOLIC PANEL
ALBUMIN: 4.4 g/dL (ref 3.5–5.0)
ALK PHOS: 51 U/L (ref 38–126)
ALT: 17 U/L (ref 0–44)
AST: 19 U/L (ref 15–41)
Anion gap: 7 (ref 5–15)
BILIRUBIN TOTAL: 0.6 mg/dL (ref 0.3–1.2)
BUN: 11 mg/dL (ref 6–20)
CALCIUM: 9.2 mg/dL (ref 8.9–10.3)
CO2: 25 mmol/L (ref 22–32)
Chloride: 109 mmol/L (ref 98–111)
Creatinine, Ser: 0.55 mg/dL (ref 0.44–1.00)
GFR calc Af Amer: >60 mL/min (ref >60)
GFR calc non Af Amer: >60 mL/min (ref >60)
GLUCOSE: 90 mg/dL (ref 70–99)
Potassium: 3.4 mmol/L — ABNORMAL LOW (ref 3.5–5.1)
Sodium: 141 mmol/L (ref 135–145)
Total Protein: 7.3 g/dL (ref 6.5–8.1)

## 2018-04-26 LAB — SALICYLATE LEVEL

## 2018-04-26 LAB — PREGNANCY, URINE: Preg Test, Ur: NEGATIVE

## 2018-04-26 LAB — ACETAMINOPHEN LEVEL: Acetaminophen (Tylenol), Serum: <10 ug/mL — ABNORMAL LOW (ref 10–30)

## 2018-04-26 MED ORDER — CITALOPRAM HYDROBROMIDE 10 MG PO TABS: 40.0000 mg | ORAL_TABLET | Freq: Every day | ORAL | Status: DC

## 2018-04-26 MED ORDER — ARIPIPRAZOLE 5 MG PO TABS
5.0000 mg | ORAL_TABLET | Freq: Every day | ORAL | Status: DC
  Administered 2018-04-26: 5 mg via ORAL
  Filled 2018-04-26 (×3): qty 1

## 2018-04-26 MED ORDER — ACETAMINOPHEN 325 MG PO TABS: 650.0000 mg | ORAL_TABLET | Freq: Four times a day (QID) | ORAL | Status: DC | PRN

## 2018-04-26 MED ORDER — ALUM & MAG HYDROXIDE-SIMETH 200-200-20 MG/5ML PO SUSP: 30.0000 mL | ORAL | Status: DC | PRN

## 2018-04-26 MED ORDER — ARIPIPRAZOLE 5 MG PO TABS: 5.0000 mg | ORAL_TABLET | Freq: Every day | ORAL | Status: DC

## 2018-04-26 MED ORDER — CITALOPRAM HYDROBROMIDE 40 MG PO TABS
40.0000 mg | ORAL_TABLET | Freq: Every day | ORAL | Status: DC
  Administered 2018-04-26 – 2018-04-30 (×5): 40 mg via ORAL
  Filled 2018-04-26 (×7): qty 1
  Filled 2018-04-26: qty 2

## 2018-04-26 MED ORDER — MAGNESIUM HYDROXIDE 400 MG/5ML PO SUSP: 30.0000 mL | Freq: Every day | ORAL | Status: DC | PRN

## 2018-04-26 MED ORDER — HYDROXYZINE HCL 25 MG PO TABS: 25.0000 mg | ORAL_TABLET | Freq: Three times a day (TID) | ORAL | Status: DC | PRN

## 2018-04-26 MED ORDER — TRAZODONE HCL 50 MG PO TABS
50.0000 mg | ORAL_TABLET | Freq: Every evening | ORAL | Status: DC | PRN
  Administered 2018-04-26: 50 mg via ORAL
  Filled 2018-04-26 (×2): qty 1

## 2018-04-26 NOTE — ED Notes (Signed)
Pt discharged safely with [place] driver.  All belongings sent with pt.

## 2018-04-26 NOTE — Tx Team (Signed)
Initial Treatment Plan 04/26/2018 6:42 PM Smiley Houseman XJO:[id number]    PATIENT STRESSORS: Marital or family conflict   PATIENT STRENGTHS: Ability for insight Average or above average intelligence General fund of knowledge Physical Health Work skills   PATIENT IDENTIFIED PROBLEMS: depression  anxiety  Work conflict                 DISCHARGE CRITERIA:  Ability to meet basic life and health needs Adequate post-discharge living arrangements Improved stabilization in mood, thinking, and/or behavior Motivation to continue treatment in a less acute level of care Need for constant or close observation no longer present Reduction of life-threatening or endangering symptoms to within safe limits Safe-care adequate arrangements made Verbal commitment to aftercare and medication compliance  PRELIMINARY DISCHARGE PLAN: Outpatient therapy Return to previous living arrangement Return to previous work or school arrangements  PATIENT/FAMILY INVOLVEMENT: This treatment plan has been presented to and reviewed with the patient, Shannon [name], and/or family member, .  The patient and family have been given the opportunity to ask questions and make suggestions.  Beatrix Shipper, RN 04/26/2018, 6:42 PM
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
## 2018-04-26 NOTE — Progress Notes (Signed)
D: Pt was in bed in her room upon initial approach.  Pt presents with depressed affect and mood.  When asked how she is feeling, pt states "like I want to sleep."  Pt denies SI/HI, denies hallucinations, denies pain.  Pt has been in her room for the majority of the evening.    A: Introduced self to pt.  Met with pt 1:1.  Actively listened to pt and offered support and encouragement.  Medications administered per order.  PRN medication administered for sleep.  Q15 minute safety checks maintained.  R: Pt is safe on the unit.  Pt is compliant with medications.  Pt verbally contracts for safety.  Will continue to monitor and assess.

## 2018-04-26 NOTE — ED Notes (Signed)
Patient wanded by security. 
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
## 2018-04-26 NOTE — [facility] Group Notes (Signed)
Pt did not attend wrap up group this evening. Pt was asleep in bed.  

## 2018-04-26 NOTE — BH Assessment (Addendum)
[facility] Assessment Progress Note  Per Elta Guadeloupe, FNP, this pt requires psychiatric hospitalization at this time.  Berneice Heinrich, RN, [facility] reports that a bed will be available at [facility], and that she will call back with an assignment.  She advises this Clinical research associate to review consent forms with pt.  Pt has signed Voluntary Admission and Consent for Treatment, as well as Consent to Release Information, and signed forms have been faxed to [facility].  Pt's nurse, Kendal Hymen, has been notified, and agrees to send original paperwork along with pt via Juel Burrow, and to call report to [phone] when the time comes.  Doylene Canning, [place] [facility] Coordinator [phone]   Addendum:  Per Inetta Fermo, pt is assigned to 404-2, and bed is now available.  Kendal Hymen has been informed.  Doylene Canning, [place] [facility] Coordinator [phone]

## 2018-04-27 DIAGNOSIS — F332 Major depressive disorder, recurrent severe without psychotic features: Principal | ICD-10-CM

## 2018-04-27 DIAGNOSIS — F419 Anxiety disorder, unspecified: Secondary | ICD-10-CM

## 2018-04-27 DIAGNOSIS — G4700 Insomnia, unspecified: Secondary | ICD-10-CM

## 2018-04-27 MED ORDER — BUPROPION HCL ER (XL) 150 MG PO TB24
150.0000 mg | ORAL_TABLET | Freq: Every day | ORAL | Status: DC
  Administered 2018-04-28 – 2018-05-01 (×4): 150 mg via ORAL
  Filled 2018-04-27 (×7): qty 1

## 2018-04-27 NOTE — Progress Notes (Signed)
Patient ID: Shannon [name], female   DOB: 1985-11-30, 33 y.o.   MRN: [id number]  Nursing Progress Note [id number]  Data: On initial approach, patient is seen resting in bed awake. Patient presents with flat affect and depressed mood. Patient did appear guarded and was minimal during interaction with Clinical research associatewriter. Patient denies concerns. Patient denies pain/physical complaints. Patient provided but declined to complete their self-inventory sheet. Patient currently denies SI/HI/AVH. Patient is isolative to her room.  Action: Patient is educated about and offered medication per provider's orders. Patient safety maintained with q15 min safety checks and frequent rounding. Low fall risk precautions in place. Emotional support given. 1:1 interaction and active listening provided. Patient encouraged to attend meals, groups, and work on treatment plan and goals. Labs, vital signs and patient behavior monitored throughout shift.   Response: Patient remains safe on the unit at this time and agrees to come to staff with any issues/concerns. Will continue to support and monitor.

## 2018-04-27 NOTE — Progress Notes (Signed)
D: Pt was in dayroom upon initial approach.  Pt presents with depressed affect and mood.  She reports her day has been "all right" and her goal is to "be safe."  Pt reports she had a good visit with her best friend tonight.  Pt denies SI/HI, denies hallucinations, denies pain.  Pt has been visible in milieu interacting with peers and staff appropriately.  Pt attended evening group.    A: Introduced self to pt.  Met with pt 1:1.  Actively listened to pt and offered support and encouragement.  Medication administered per order.  Q15 minute safety checks maintained.  R: Pt is safe on the unit.  Pt is compliant with medication.  Pt verbally contracts for safety.  Will continue to monitor and assess.

## 2018-04-27 NOTE — Progress Notes (Signed)
Adult Psychoeducational Group Note  Date:  04/27/2018 Time:  9:03 PM  Group Topic/Focus:  Wrap-Up Group:   The focus of this group is to help patients review their daily goal of treatment and discuss progress on daily workbooks.  Participation Level:  Active  Participation Quality:  Appropriate  Affect:  Appropriate  Cognitive:  Alert  Insight: Appropriate  Engagement in Group:  Engaged  Modes of Intervention:  Discussion  Additional Comments:  Patient stated having an okay day. Patient's goal for today was to learn coping skills for divorce. Patient stated she did not meet her goal.  Collan Schoenfeld L Rogan Ecklund 04/27/2018, 9:03 PM
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
## 2018-04-27 NOTE — Tx Team (Signed)
Interdisciplinary Treatment and Diagnostic Plan Update  04/27/2018 Time of Session:  Shannon [name] MRN: [id number]  Principal Diagnosis: <principal problem not specified>  Secondary Diagnoses: Active Problems:   MDD (major depressive disorder), recurrent severe, without psychosis ([place])   Current Medications:  Current Facility-Administered Medications  Medication Dose Route Frequency Provider Last Rate Last Dose  . acetaminophen (TYLENOL) tablet 650 mg  650 mg Oral Q6H PRN Ethelene Hal, NP      . alum & mag hydroxide-simeth (MAALOX/MYLANTA) 200-200-20 MG/5ML suspension 30 mL  30 mL Oral Q4H PRN Ethelene Hal, NP      . Derrill Memo ON 04/28/2018] buPROPion (WELLBUTRIN XL) 24 hr tablet 150 mg  150 mg Oral Daily Cobos, Fernando A, MD      . citalopram (CELEXA) tablet 40 mg  40 mg Oral QHS Ethelene Hal, NP   40 mg at 04/26/18 2112  . hydrOXYzine (ATARAX/VISTARIL) tablet 25 mg  25 mg Oral TID PRN Ethelene Hal, NP      . magnesium hydroxide (MILK OF MAGNESIA) suspension 30 mL  30 mL Oral Daily PRN Ethelene Hal, NP      . traZODone (DESYREL) tablet 50 mg  50 mg Oral QHS PRN Ethelene Hal, NP   50 mg at 04/26/18 2113   PTA Medications: Medications Prior to Admission  Medication Sig Dispense Refill Last Dose  . ARIPiprazole (ABILIFY) 5 MG tablet Take 5 mg by mouth at bedtime.   04/25/2018 at Unknown time  . citalopram (CELEXA) 40 MG tablet TAKE ONE TABLET BY MOUTH ONCE DAILY (Patient taking differently: Take 40 mg by mouth at bedtime. ) 30 tablet 0 04/25/2018 at Unknown time  . lamoTRIgine (LAMICTAL) 25 MG tablet TAKE TWO TABLETS BY MOUTH ONCE DAILY (Patient not taking: Reported on 04/26/2018) 60 tablet 0 Not Taking at Unknown time  . lidocaine (LIDODERM) 5 % Place 1 patch onto the skin daily. Remove & Discard patch within 12 hours or as directed by MD (Patient not taking: Reported on 04/26/2018) 30 patch 0 Not Taking at Unknown time  . methocarbamol  (ROBAXIN) 500 MG tablet Take 1 tablet (500 mg total) by mouth 2 (two) times daily. (Patient not taking: Reported on 04/26/2018) 14 tablet 0 Not Taking at Unknown time    Patient Stressors: Marital or family conflict  Patient Strengths: Ability for insight Average or above average intelligence General fund of knowledge Physical Health Work skills  Treatment Modalities: Medication Management, Group therapy, Case management,  1 to 1 session with clinician, Psychoeducation, Recreational therapy.   Physician Treatment Plan for Primary Diagnosis: <principal problem not specified> Long Term Goal(s): Improvement in symptoms so as ready for discharge Improvement in symptoms so as ready for discharge   Short Term Goals: Ability to identify changes in lifestyle to reduce recurrence of condition will improve Ability to maintain clinical measurements within normal limits will improve Ability to identify changes in lifestyle to reduce recurrence of condition will improve Ability to verbalize feelings will improve Ability to disclose and discuss suicidal ideas Ability to demonstrate self-control will improve Ability to identify and develop effective coping behaviors will improve Ability to maintain clinical measurements within normal limits will improve  Medication Management: Evaluate patient's response, side effects, and tolerance of medication regimen.  Therapeutic Interventions: 1 to 1 sessions, Unit Group sessions and Medication administration.  Evaluation of Outcomes: Not Met  Physician Treatment Plan for Secondary Diagnosis: Active Problems:   MDD (major depressive disorder), recurrent severe, without psychosis ([place])  Long Term Goal(s): Improvement in symptoms so as ready for discharge Improvement in symptoms so as ready for discharge   Short Term Goals: Ability to identify changes in lifestyle to reduce recurrence of condition will improve Ability to maintain clinical measurements  within normal limits will improve Ability to identify changes in lifestyle to reduce recurrence of condition will improve Ability to verbalize feelings will improve Ability to disclose and discuss suicidal ideas Ability to demonstrate self-control will improve Ability to identify and develop effective coping behaviors will improve Ability to maintain clinical measurements within normal limits will improve     Medication Management: Evaluate patient's response, side effects, and tolerance of medication regimen.  Therapeutic Interventions: 1 to 1 sessions, Unit Group sessions and Medication administration.  Evaluation of Outcomes: Not Met   RN Treatment Plan for Primary Diagnosis: <principal problem not specified> Long Term Goal(s): Knowledge of disease and therapeutic regimen to maintain health will improve  Short Term Goals: Ability to participate in decision making will improve, Ability to verbalize feelings will improve, Ability to disclose and discuss suicidal ideas and Ability to identify and develop effective coping behaviors will improve  Medication Management: RN will administer medications as ordered by provider, will assess and evaluate patient's response and provide education to patient for prescribed medication. RN will report any adverse and/or side effects to prescribing provider.  Therapeutic Interventions: 1 on 1 counseling sessions, Psychoeducation, Medication administration, Evaluate responses to treatment, Monitor vital signs and CBGs as ordered, Perform/monitor CIWA, COWS, AIMS and Fall Risk screenings as ordered, Perform wound care treatments as ordered.  Evaluation of Outcomes: Not Met   LCSW Treatment Plan for Primary Diagnosis: <principal problem not specified> Long Term Goal(s): Safe transition to appropriate next level of care at discharge, Engage patient in therapeutic group addressing interpersonal concerns.  Short Term Goals: Engage patient in aftercare  planning with referrals and resources  Therapeutic Interventions: Assess for all discharge needs, 1 to 1 time with Social worker, Explore available resources and support systems, Assess for adequacy in community support network, Educate family and significant other(s) on suicide prevention, Complete Psychosocial Assessment, Interpersonal group therapy.  Evaluation of Outcomes: Not Met   Progress in Treatment: Attending groups: No. New to unit  Participating in groups: No. Taking medication as prescribed: Yes.  Toleration medication: Yes. Family/Significant other contact made: No, will contact:  if patient consents to collateral contacts  Patient understands diagnosis: Yes. Discussing patient identified problems/goals with staff: Yes. Medical problems stabilized or resolved: Yes. Denies suicidal/homicidal ideation: Yes. Issues/concerns per patient self-inventory: No. Other:   New problem(s) identified: None   New Short Term/Long Term Goal(s): medication stabilization, elimination of SI thoughts, development of comprehensive mental wellness plan.    Patient Goals: Help with depression and anxiety    Discharge Plan or Barriers: CSW will continue to assess for collateral contacts and to develop an appropriate discharge plan.   Reason for Continuation of Hospitalization: Anxiety Depression Medication stabilization Suicidal ideation  Estimated Length of Stay: 05/01/2018  Attendees: Patient: 04/27/2018 1:41 PM  Physician: Dr. Neita Garnet, MD 04/27/2018 1:41 PM  Nursing: Estill Bamberg.Loletha Grayer RN 04/27/2018 1:41 PM  RN Care Manager: 04/27/2018 1:41 PM  Social Worker: Radonna Ricker, [place] 04/27/2018 1:41 PM  Recreational Therapist:  04/27/2018 1:41 PM  Other: Mable Paris, NP 04/27/2018 1:41 PM  Other:  04/27/2018 1:41 PM  Other: 04/27/2018 1:41 PM    Scribe for Treatment Team: Marylee Floras, [place] 04/27/2018 1:41 PM

## 2018-04-27 NOTE — Progress Notes (Signed)
Recreation Therapy Notes  Date: 1.10.20 Time: 0930 Location: [address] Dayroom  Group Topic: Stress Management  Goal Area(s) Addresses:  Patient will identify ways to cope with stress.  Patient will identify stress management techniques. Patient will identify benefits of using stress management post d/c.  Intervention: Stress Management  Activity :  Meditation.  LRT introduced the stress management technique of meditation.  LRT played a meditation that allowed patients to focus on resiliency. Patients were to follow along as meditation played in order to engage in activity.  Education:  Stress Management, Discharge Planning.   Education Outcome: Acknowledges Education  Clinical Observations/Feedback: Pt did not attend group.    Shaylon Aden, LRT/CTRS         Saurav Crumble A 04/27/2018 11:46 AM 
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
## 2018-04-28 LAB — URINALYSIS, COMPLETE (UACMP) WITH MICROSCOPIC
Bilirubin Urine: NEGATIVE
Glucose, UA: NEGATIVE mg/dL
Ketones, ur: NEGATIVE mg/dL
Leukocytes, UA: NEGATIVE
Nitrite: NEGATIVE
Protein, ur: NEGATIVE mg/dL
Specific Gravity, Urine: 1.004 — ABNORMAL LOW (ref 1.005–1.030)
pH: 7 (ref 5.0–8.0)

## 2018-04-28 LAB — TSH: TSH: 0.605 u[IU]/mL (ref 0.350–4.500)

## 2018-04-28 MED ORDER — HYDROCORTISONE 1 % EX CREA
TOPICAL_CREAM | Freq: Three times a day (TID) | CUTANEOUS | Status: DC | PRN
  Administered 2018-04-28 – 2018-04-30 (×2): via TOPICAL
  Filled 2018-04-28: qty 28

## 2018-04-28 MED ORDER — LORAZEPAM 0.5 MG PO TABS
0.5000 mg | ORAL_TABLET | Freq: Four times a day (QID) | ORAL | Status: DC | PRN
  Administered 2018-04-28 – 2018-04-30 (×3): 0.5 mg via ORAL
  Filled 2018-04-28 (×3): qty 1

## 2018-04-28 NOTE — BHH Group Notes (Signed)
Adult Psychoeducational Group Note  Date:  04/28/2018 Time:  9:23 PM  Group Topic/Focus:  Wrap-Up Group:   The focus of this group is to help patients review their daily goal of treatment and discuss progress on daily workbooks.  Participation Level:  Active  Participation Quality:  Appropriate  Affect:  Appropriate  Cognitive:  Appropriate  Insight: Good  Engagement in Group:  Engaged  Modes of Intervention:  Discussion  Additional Comments:  Pt rated her day an 5 because she did not get a visit from husband and pt also stated that her goals were not met   Toni Demo A 04/28/2018, 9:23 PM

## 2018-04-28 NOTE — Progress Notes (Signed)
D: Pt was at nurse's station upon initial approach.  Pt presents with depressed affect and mood.  She is seen smiling and laughing on occasion.  Her goal today was "not to cry."  Pt reports she did not meet her goal because "I was hoping my husband was going to visit me but I just saw my friend and that made me happy."  Pt denies SI/HI, denies hallucinations, denies pain.  Pt has been visible in milieu interacting with peers and staff appropriately.  Pt attended evening group.  Pt showed writer a raised area on the back of her left leg which appeared to be a bug bite and requested medication for itching.  A: Met with pt 1:1.  Actively listened to pt and offered support and encouragement.  On-site provider notified of pt's request and PRN medication for itching was ordered and administered.  Medication administered per order.  Q15 minute safety checks maintained.  R: Pt is safe on the unit.  Pt is compliant with medications.  Pt verbally contracts for safety.  Will continue to monitor and assess.   
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
## 2018-04-28 NOTE — Plan of Care (Signed)
D: Patient presents laying in bed and appears depressed, withdrawn. She declines to discuss what is making her feel depressed today. Her goal today is "No crying" and "keep myself busy." She did share that she has been feeling this way for the past week. She slept well last night, and did not request medication to help with sleep. Her appetite is good, energy low and concentration good. She rates her depression and feeling of hopelessness 3/10 and anxiety 0/10. She denies withdrawal symptoms or physical complaints. Patient denies SI/HI/AVH.  A: Patient checked q15 min, and checks reviewed. Reviewed medication changes with patient and educated on side effects. Educated patient on importance of attending group therapy sessions and educated on several coping skills. Encouarged participation in milieu through recreation therapy and attending meals with peers. Support and encouragement provided. Fluids offered. R: Patient receptive to education on medications, and is medication compliant. Patient contracts for safety on the unit.

## 2018-04-28 NOTE — BHH Group Notes (Signed)
LCSW Group Therapy Note  04/28/2018   10:00-11:00am   Type of Therapy and Topic:  Group Therapy: Anger Cues and Responses  Participation Level:  Active   Description of Group:   In this group, patients learned how to recognize the physical, cognitive, emotional, and behavioral responses they have to anger-provoking situations.  They identified a recent time they became angry and how they reacted.  They analyzed how their reaction was possibly beneficial and how it was possibly unhelpful.  The group discussed a variety of healthier coping skills that could help with such a situation in the future.  Deep breathing was practiced briefly.  Therapeutic Goals: 1. Patients will remember their last incident of anger and how they felt emotionally and physically, what their thoughts were at the time, and how they behaved. 2. Patients will identify how their behavior at that time worked for them, as well as how it worked against them. 3. Patients will explore possible new behaviors to use in future anger situations. 4. Patients will learn that anger itself is normal and cannot be eliminated, and that healthier reactions can assist with resolving conflict rather than worsening situations.  Summary of Patient Progress:  The patient shared that her most recent time of anger was just prior to admission and said her husband said he wants a divorce so she tore apart her room, "acted over the top."  Another patient was talking frequently throughout group about anger not being "worth it" and patient reacted to this, saying that she was not in control of her reactions and therefore did not have a chance to really decide if it was "worth it."  Therapeutic Modalities:   Cognitive Behavioral Therapy  Shannon [name]

## 2018-04-28 NOTE — Progress Notes (Addendum)
[facility] MD Progress Note  04/28/2018 1:34 PM Shannon [name]  MRN:  [id number] Subjective: Patient reports some improvement today.  States she is feeling less severely depressed.  Denies suicidal ideations.  Currently denies medication side effects.  Reports urinary frequency and expressed concern she might have a UTI.  Denies other symptoms-no dysuria, no flank or back pain, no fever, no chills.  Objective: I have reviewed chart notes and have met with patient. 33 year old married female, presented to hospital voluntarily reporting worsening depression, neurovegetative symptoms of depression, suicidal ideations.  Contributing factors include marital discord/husband wanting to separate . On day of admission she had attempted suicide by carbon monoxide poisoning by sitting inside her idling car until her husband opened the door. She reports past history of mood disorder, she states she has been diagnosed with bipolar disorder but we reviewed her past psychiatric history and she does not endorse any clear history of hypomania or mania.  She does endorse a history of recurrent depression. Patient reports some improvement compared to admission.  She states she has been able to distract herself by speaking with peers in dayroom.  She states she has not been tearful today.  She is visible in dayroom, calm/cooperative on approach.  She does continue to ruminate about her major stressors, mainly marital conflict/separation.  Denies medication side effects thus far-tolerating Celexa , Wellbutrin XL combination well thus far.  TSH-0.605 Principal Problem: MDD Diagnosis: Active Problems:   MDD (major depressive disorder), recurrent severe, without psychosis ([place])  Total Time spent with patient: 15 minutes  Past Psychiatric History:   Past Medical History:  Past Medical History:  Diagnosis Date  . Anxiety   . Clenching of teeth    states due to stress   . Degenerative arthritis of right shoulder region  10/2013  . Depression   . 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 Disorders of bursae and tendons in shoulder region, unspecified 10/2013   right  . Recent upper respiratory tract infection 10/22/2013   states still has some congestion    Past Surgical History:  Procedure Laterality Date  . SHOULDER ARTHROSCOPY WITH DISTAL CLAVICLE RESECTION Right 10/24/2013   Procedure: RIGHT SHOULDER DEBRIDEMENT EXTENSIVE, DISTAL CLAVICULECTOMY, DECOMPRESSION SUBACROMIAL PARTIAL ACROMIOPLASTY WITH DEBRIDEMENT OF LABRIUM;  Surgeon: Ninetta Lights, MD;  Location: [place];  Service: Orthopedics;  Laterality: Right;  . TONSILLECTOMY    . TUBAL LIGATION  10/19/2011   Procedure: POST PARTUM TUBAL LIGATION;  Surgeon: [name]. Landry Mellow, MD;  Location: [facility];  Service: Gynecology;  Laterality: Bilateral;  . WISDOM TOOTH EXTRACTION     Family History:  Family History  Problem Relation Age of Onset  . Bipolar disorder Mother   . Heart disease Maternal Grandfather   . Hypertension Maternal Grandfather   . Cancer Maternal Grandfather   . Depression Sister   . Anxiety disorder Father   . Anxiety disorder Sister   . Anxiety disorder Brother    Family Psychiatric  History:  Social History:  Social History   Substance and Sexual Activity  Alcohol Use No  . Alcohol/week: 0.0 standard drinks     Social History   Substance and Sexual Activity  Drug Use Yes  . Types: Marijuana    Social History   Socioeconomic History  . Marital status: Married    Spouse name: Not on file  . Number of children: 2  . Years of education: Not on file  . Highest education level:  Not on file  Occupational History  . Occupation: Therapist, art rep for [employer]    Employer: [employer]  Social Needs  . Financial resource strain: Not on file  . Food insecurity:    Worry: Not on file    Inability: Not on file  . Transportation needs:    Medical: Not on file    Non-medical: Not on file  Tobacco  Use  . Smoking status: Former Smoker    Last attempt to quit: 10/13/2009    Years since quitting: 8.5  . Smokeless tobacco: Never Used  Substance and Sexual Activity  . Alcohol use: No    Alcohol/week: 0.0 standard drinks  . Drug use: Yes    Types: Marijuana  . Sexual activity: Yes    Partners: Male    Birth control/protection: Surgical  Lifestyle  . Physical activity:    Days per week: Not on file    Minutes per session: Not on file  . Stress: Not on file  Relationships  . Social connections:    Talks on phone: Not on file    Gets together: Not on file    Attends religious service: Not on file    Active member of club or organization: Not on file    Attends meetings of clubs or organizations: Not on file    Relationship status: Not on file  Other Topics Concern  . Not on file  Social History Narrative   Married, 2 daughters, 1 dog, 1 cat.   Additional Social History:   Sleep: Improving  Appetite:  Fair/improving  Current Medications: Current Facility-Administered Medications  Medication Dose Route Frequency Provider Last Rate Last Dose  . acetaminophen (TYLENOL) tablet 650 mg  650 mg Oral Q6H PRN Ethelene Hal, NP      . alum & mag hydroxide-simeth (MAALOX/MYLANTA) 200-200-20 MG/5ML suspension 30 mL  30 mL Oral Q4H PRN Ethelene Hal, NP      . buPROPion (WELLBUTRIN XL) 24 hr tablet 150 mg  150 mg Oral Daily Cobos, Myer Peer, MD   150 mg at 04/28/18 0805  . citalopram (CELEXA) tablet 40 mg  40 mg Oral QHS Ethelene Hal, NP   40 mg at 04/27/18 2103  . hydrOXYzine (ATARAX/VISTARIL) tablet 25 mg  25 mg Oral TID PRN Ethelene Hal, NP      . magnesium hydroxide (MILK OF MAGNESIA) suspension 30 mL  30 mL Oral Daily PRN Ethelene Hal, NP      . traZODone (DESYREL) tablet 50 mg  50 mg Oral QHS PRN Ethelene Hal, NP   50 mg at 04/26/18 2113    Lab Results:  Results for orders placed or performed during the hospital encounter of  04/26/18 (from the past 48 hour(s))  TSH     Status: None   Collection Time: 04/28/18  7:53 AM  Result Value Ref Range   TSH 0.605 0.350 - 4.500 uIU/mL    Comment: Performed by a 3rd Generation assay with a functional sensitivity of <=0.01 uIU/mL. Performed at [facility], [place] [address], [place], [place] [address]     Blood Alcohol level:  No results found for: [facility]  Metabolic Disorder Labs: No results found for: HGBA1C, MPG No results found for: PROLACTIN No results found for: CHOL, TRIG, HDL, CHOLHDL, VLDL, LDLCALC  Physical Findings: AIMS: Facial and Oral Movements Muscles of Facial Expression: None, normal Lips and Perioral Area: None, normal Jaw: None, normal Tongue: None, normal,Extremity Movements Upper (arms,  wrists, hands, fingers): None, normal Lower (legs, knees, ankles, toes): None, normal, Trunk Movements Neck, shoulders, hips: None, normal, Overall Severity Severity of abnormal movements (highest score from questions above): None, normal Incapacitation due to abnormal movements: None, normal Patient's awareness of abnormal movements (rate only patient's report): No Awareness, Dental Status Current problems with teeth and/or dentures?: No Does patient usually wear dentures?: No  CIWA:  CIWA-Ar Total: 0 COWS:  COWS Total Score: 0  Musculoskeletal: Strength & Muscle Tone: within normal limits Gait & Station: normal Patient leans: N/A  Psychiatric Specialty Exam: Physical Exam  ROS no headache, no chest pain, no shortness of breath, no vomiting, describes some urinary urgency, no dysuria, no fever, no chills  Blood pressure 109/80, pulse 78, temperature 98.8 F (37.1 C), temperature source Oral, resp. rate 16, height 5' 9"  (1.753 m), weight 78.5 kg, last menstrual period 04/08/2018.Body mass index is 25.55 kg/m.  General Appearance: Improving grooming  Eye Contact:  Good  Speech:  Normal Rate  Volume:  Normal  Mood:  Reports some  improvement compared to admission, does remain depressed  Affect:  Still constricted, not tearful, smiles briefly at times  Thought Process:  Linear and Descriptions of Associations: Intact  Orientation:  Full (Time, Place, and Person)  Thought Content:  No hallucinations, no delusions, remains ruminative about her stressors  Suicidal Thoughts:  No denies suicidal or self-injurious ideations, denies homicidal or violent ideations, contracts for safety on unit  Homicidal Thoughts:  No  Memory:  Recent and remote grossly intact  Judgement:  improving  Insight: Improving  Psychomotor Activity:  Normal  Concentration:  Concentration: Good and Attention Span: Good  Recall:  Good  Fund of Knowledge:  Good  Language:  Good  Akathisia:  Negative  Handed:  Right  AIMS (if indicated):     Assets:  Desire for Improvement Resilience  ADL's:  Intact  Cognition:  WNL  Sleep:  Number of Hours: 6.75   Assessment-  33 year old married female, presented to hospital voluntarily reporting worsening depression, neurovegetative symptoms of depression, suicidal ideations.  Contributing factors include marital discord/husband wanting to separate . On day of admission she had attempted suicide by carbon monoxide poisoning by sitting inside her idling car until her husband opened the door.  Patient describes some improvement compared to admission.  Remains depressed and vaguely constricted but affect is improving compared to admission and today is not tearful, smiles at times appropriately.  She denies suicidal ideations and has been more interactive with peers/spending time in dayroom.  Currently tolerating medications well, does not endorse side effects. Describes some urinary urgency  Treatment Plan Summary: Daily contact with patient to assess and evaluate symptoms and progress in treatment, Medication management, Plan Inpatient treatment and Medications as below  Encourage group and milieu participation  to work on coping skills and symptom reduction. Treatment team working on disposition planning options Continue Wellbutrin XL 150 mg daily for depression Continue Celexa 40 mg daily for depression and anxiety Continue Trazodone 50 mg nightly PRN for insomnia as needed Ativan 0.5 mgr Q 6 hours PRN for anxiety as needed  Check UA, U culture Of note, noted that NP ordered HIV, chlamydia/gonorrhea panel.  Patient states she does not suspect STD, minimizes risk factors, but agrees to proceed with testing. Jenne Campus, MD 04/28/2018, 1:34 PM

## 2018-04-29 LAB — HIV ANTIBODY (ROUTINE TESTING W REFLEX): HIV Screen 4th Generation wRfx: NONREACTIVE

## 2018-04-29 NOTE — Progress Notes (Signed)
D: Pt denies SI/HI/AV hallucinations. Pt has been observed on unit interacting with peers and in a pleasant mood. Patient goal for today was "not to cry." A: Pt was offered support and encouragement. Pt was given scheduled medications. Pt was encourage to attend groups. Q 15 minute checks were done for safety.  R:Pt attends groups and interacts well with peers and staff. Pt is taking medication. Pt has no complaints.Pt receptive to treatment and safety maintained on unit.

## 2018-04-29 NOTE — [facility] Group Notes (Signed)
[facility] LCSW Group Therapy Note  04/29/2018   10:00-11:00AM  Type of Therapy and Topic:  Group Therapy:  Unhealthy versus Healthy Supports, Which Am I?  Participation Level:  Active   Description of Group:  Patients in this group were introduced to the concept that additional supports including self-support are an essential part of recovery.  Initially a discussion was held about the differences between healthy versus unhealthy supports.  Patients were asked to share what unhealthy supports in their lives need to be addressed, as well as what additional healthy supports could be added for greater help in reaching their goals.   A song entitled "My Own Hero" was played and a group discussion ensued in which patients stated they could relate to the song and it inspired them to realize they have be willing to help themselves in order to succeed, because other people cannot achieve sobriety or stability for them.  We discussed adding a variety of healthy supports to address the various needs in patient lives, including becoming more self-supportive.  Therapeutic Goals: 1)  Highlight the differences between healthy and unhealthy supports 2)  Suggest the importance of being a part of one's own support system 2)  Discuss reasons people in one's life may eventually be unable to be continually supportive  3)  Identify the patient's current support system and   4)  elicit commitments to add healthy supports and to become more conscious of being self-supportive   Summary of Patient Progress:  The patient expressed that the unhealthy support which needs to be addressed includes helping her family and best friend to understand her illness better.  Healthy supports which could be added for increased stability and happiness include counseling and marriage counseling.  She teased other patients a lot, in an appropriate manner, but did not participate in much of the deep discussion.  Therapeutic Modalities:    Motivational Interviewing Activity  Lynnell Chad

## 2018-04-29 NOTE — Plan of Care (Signed)
D: Pt presents with a flat affect and a depressed mood. Pt appeared guarded on approach with minimal engagement. Pt rated on her self inventory sheet: depression 3/10, anxiety 0 and hopelessness 0. Pt denies SI/HI. Pt reported fair sleep last night. Pt reported having a fair appetite today. Pt compliant with taking meds and denies any side effects to Wellbutrin.   A: Medications reviewed with pt. Medications administered as ordered per MD. V/s assessed. Verbal support provided. 15 minute checks performed for safety.  R: Pt compliant with tx plan. Pt stated goal "no crying."  Care-Plan Problem: Education: Goal: Emotional status will improve Outcome: Progressing Goal: Mental status will improve Outcome: Progressing

## 2018-04-29 NOTE — Progress Notes (Signed)
[facility] MD Progress Note  04/29/2018 11:42 AM Shannon [name]  MRN:  [id number] Subjective: Reports improvement compared to admission.  Feels less depressed.  Denies suicidal ideations.  Does continue to tend to ruminate about marital stressors.  States she is upset that he has not called her on unit or visited.  She plans, however, to avoid calling him and focusing on herself and coping skills at this time. Currently tolerating medications well. Objective: I have reviewed chart notes and have met with patient. 33 year old married female, presented to hospital voluntarily reporting worsening depression, neurovegetative symptoms of depression, suicidal ideations.  Contributing factors include marital discord/husband wanting to separate . On day of admission she had attempted suicide by carbon monoxide poisoning by sitting inside her idling car until her husband opened the door. Patient describes improving mood, feeling better, she is becoming more future oriented and currently focused on disposition planning, hoping to discharge soon.  She remains vaguely depressed, constricted, although affect is more reactive.  Smiles at times appropriately.  Also noted to be interacting appropriately, laughing with peers in dayroom.  Thus far tolerating medications well.  She is on Celexa and Wellbutrin XL.  Denies side effects. Denies suicidal ideations. No disruptive or agitated behaviors.  Labs reviewed-UA negative for nitrites and leukocytes.  TSH 0.605 Principal Problem: MDD Diagnosis: Active Problems:   MDD (major depressive disorder), recurrent severe, without psychosis ([place])  Total Time spent with patient: 15 minutes  Past Psychiatric History:   Past Medical History:  Past Medical History:  Diagnosis Date  . Anxiety   . Clenching of teeth    states due to stress   . Degenerative arthritis of right shoulder region 10/2013  . Depression   . Disorder of articular cartilage of right shoulder joint  10/2013  . Disorders of bursae and tendons in shoulder region, unspecified 10/2013   right  . Recent upper respiratory tract infection 10/22/2013   states still has some congestion    Past Surgical History:  Procedure Laterality Date  . SHOULDER ARTHROSCOPY WITH DISTAL CLAVICLE RESECTION Right 10/24/2013   Procedure: RIGHT SHOULDER DEBRIDEMENT EXTENSIVE, DISTAL CLAVICULECTOMY, DECOMPRESSION SUBACROMIAL PARTIAL ACROMIOPLASTY WITH DEBRIDEMENT OF LABRIUM;  Surgeon: Ninetta Lights, MD;  Location: [facility];  Service: Orthopedics;  Laterality: Right;  . TONSILLECTOMY    . TUBAL LIGATION  10/19/2011   Procedure: POST PARTUM TUBAL LIGATION;  Surgeon: [name]. Landry Mellow, MD;  Location: [facility];  Service: Gynecology;  Laterality: Bilateral;  . WISDOM TOOTH EXTRACTION     Family History:  Family History  Problem Relation Age of Onset  . Bipolar disorder Mother   . Heart disease Maternal Grandfather   . Hypertension Maternal Grandfather   . Cancer Maternal Grandfather   . Depression Sister   . Anxiety disorder Father   . Anxiety disorder Sister   . Anxiety disorder Brother    Family Psychiatric  History:  Social History:  Social History   Substance and Sexual Activity  Alcohol Use No  . Alcohol/week: 0.0 standard drinks     Social History   Substance and Sexual Activity  Drug Use Yes  . Types: Marijuana    Social History   Socioeconomic History  . Marital status: Married    Spouse name: Not on file  . Number of children: 2  . Years of education: Not on file  . Highest education level: Not on file  Occupational History  . Occupation: Therapist, art rep for [employer]    Employer:  [employer]  Social Needs  . Financial resource strain: Not on file  . Food insecurity:    Worry: Not on file    Inability: Not on file  . Transportation needs:    Medical: Not on file    Non-medical: Not on file  Tobacco Use  . Smoking status: Former Smoker    Last attempt to quit: 10/13/2009     Years since quitting: 8.5  . Smokeless tobacco: Never Used  Substance and Sexual Activity  . Alcohol use: No    Alcohol/week: 0.0 standard drinks  . Drug use: Yes    Types: Marijuana  . Sexual activity: Yes    Partners: Male    Birth control/protection: Surgical  Lifestyle  . Physical activity:    Days per week: Not on file    Minutes per session: Not on file  . Stress: Not on file  Relationships  . Social connections:    Talks on phone: Not on file    Gets together: Not on file    Attends religious service: Not on file    Active member of club or organization: Not on file    Attends meetings of clubs or organizations: Not on file    Relationship status: Not on file  Other Topics Concern  . Not on file  Social History Narrative   Married, 2 daughters, 1 dog, 1 cat.   Additional Social History:   Sleep: Improving  Appetite:  Improving  Current Medications: Current Facility-Administered Medications  Medication Dose Route Frequency Provider Last Rate Last Dose  . acetaminophen (TYLENOL) tablet 650 mg  650 mg Oral Q6H PRN Ethelene Hal, NP      . alum & mag hydroxide-simeth (MAALOX/MYLANTA) 200-200-20 MG/5ML suspension 30 mL  30 mL Oral Q4H PRN Ethelene Hal, NP      . buPROPion (WELLBUTRIN XL) 24 hr tablet 150 mg  150 mg Oral Daily Cobos, Myer Peer, MD   150 mg at 04/29/18 0802  . citalopram (CELEXA) tablet 40 mg  40 mg Oral QHS Ethelene Hal, NP   40 mg at 04/28/18 2107  . hydrocortisone cream 1 %   Topical TID PRN Lindon Romp A, NP      . LORazepam (ATIVAN) tablet 0.5 mg  0.5 mg Oral Q6H PRN Cobos, Myer Peer, MD   0.5 mg at 04/28/18 1831  . magnesium hydroxide (MILK OF MAGNESIA) suspension 30 mL  30 mL Oral Daily PRN Ethelene Hal, NP      . traZODone (DESYREL) tablet 50 mg  50 mg Oral QHS PRN Ethelene Hal, NP   50 mg at 04/26/18 2113    Lab Results:  Results for orders placed or performed during the hospital encounter of  04/26/18 (from the past 48 hour(s))  TSH     Status: None   Collection Time: 04/28/18  7:53 AM  Result Value Ref Range   TSH 0.605 0.350 - 4.500 uIU/mL    Comment: Performed by a 3rd Generation assay with a functional sensitivity of <=0.01 uIU/mL. Performed at [facility], [place] [address] [place], [place] [address]   Urinalysis, Complete w Microscopic     Status: Abnormal   Collection Time: 04/28/18  2:17 PM  Result Value Ref Range   Color, Urine STRAW (A) YELLOW   APPearance CLEAR CLEAR   Specific Gravity, Urine 1.004 (L) 1.005 - 1.030   pH 7.0 5.0 - 8.0   Glucose, UA NEGATIVE NEGATIVE mg/dL  Hgb urine dipstick SMALL (A) NEGATIVE   Bilirubin Urine NEGATIVE NEGATIVE   Ketones, ur NEGATIVE NEGATIVE mg/dL   Protein, ur NEGATIVE NEGATIVE mg/dL   Nitrite NEGATIVE NEGATIVE   Leukocytes, UA NEGATIVE NEGATIVE   RBC / HPF 0-5 0 - 5 RBC/hpf   WBC, UA 0-5 0 - 5 WBC/hpf   Bacteria, UA RARE (A) NONE SEEN   Squamous Epithelial / LPF 0-5 0 - 5    Comment: Performed at [facility], [place] [address] [place], [place] [address]    Blood Alcohol level:  No results found for: [facility]  Metabolic Disorder Labs: No results found for: HGBA1C, MPG No results found for: PROLACTIN No results found for: CHOL, TRIG, HDL, CHOLHDL, VLDL, LDLCALC  Physical Findings: AIMS: Facial and Oral Movements Muscles of Facial Expression: None, normal Lips and Perioral Area: None, normal Jaw: None, normal Tongue: None, normal,Extremity Movements Upper (arms, wrists, hands, fingers): None, normal Lower (legs, knees, ankles, toes): None, normal, Trunk Movements Neck, shoulders, hips: None, normal, Overall Severity Severity of abnormal movements (highest score from questions above): None, normal Incapacitation due to abnormal movements: None, normal Patient's awareness of abnormal movements (rate only patient's report): No Awareness, Dental Status Current problems with teeth  and/or dentures?: No Does patient usually wear dentures?: No  CIWA:  CIWA-Ar Total: 0 COWS:  COWS Total Score: 0  Musculoskeletal: Strength & Muscle Tone: within normal limits Gait & Station: normal Patient leans: N/A  Psychiatric Specialty Exam: Physical Exam  ROS no headache, no chest pain, no shortness of breath, no vomiting, describes some urinary urgency, no dysuria, no fever, no chills  Blood pressure 116/84, pulse (!) 103, temperature 98.2 F (36.8 C), temperature source Oral, resp. rate 18, height 5' 9"  (1.753 m), weight 78.5 kg, last menstrual period 04/08/2018, SpO2 100 %.Body mass index is 25.55 kg/m.  General Appearance: Well Groomed  Eye Contact:  Good  Speech:  Normal Rate  Volume:  Normal  Mood:  Describes improving mood  Affect:  Tends to be sad, constricted, but reactive and improves during session and during interactions with peers and staff  Thought Process:  Linear and Descriptions of Associations: Intact  Orientation:  Full (Time, Place, and Person)  Thought Content:  No hallucinations, no delusions, not internally preoccupied  Suicidal Thoughts:  No denies suicidal or self-injurious ideations, denies homicidal or violent ideations, specifically also denies any homicidal or violent ideations towards her husband, contracts for safety on unit  Homicidal Thoughts:  No  Memory:  Recent and remote grossly intact  Judgement:  improving  Insight: Improving  Psychomotor Activity:  Normal  Concentration:  Concentration: Good and Attention Span: Good  Recall:  Good  Fund of Knowledge:  Good  Language:  Good  Akathisia:  Negative  Handed:  Right  AIMS (if indicated):     Assets:  Desire for Improvement Resilience  ADL's:  Intact  Cognition:  WNL  Sleep:  Number of Hours: 6.75   Assessment-  33 year old married female, presented to hospital voluntarily reporting worsening depression, neurovegetative symptoms of depression, suicidal ideations.  Contributing  factors include marital discord/husband wanting to separate . On day of admission she had attempted suicide by carbon monoxide poisoning by sitting inside her idling car until her husband opened the door.  Patient mains depressed but is presenting with some improvement in mood, range of affect.  Denies suicidal ideations and presents future oriented.  Tolerating current medication regimen well thus far.   Treatment Plan Summary:  Daily contact with patient to assess and evaluate symptoms and progress in treatment, Medication management, Plan Inpatient treatment and Medications as below  Encourage group and milieu participation to work on coping skills and symptom reduction. Treatment team working on disposition planning options Continue Wellbutrin XL 150 mg daily for depression Continue Celexa 40 mg daily for depression and anxiety Continue Trazodone 50 mg nightly PRN for insomnia as needed Ativan 0.5 mgr Q 6 hours PRN for anxiety as needed   Jenne Campus, MD 04/29/2018, 11:42 AM   Patient ID: Sharol Given, female   DOB: [birth year], 33 y.o.   MRN: [id number]

## 2018-04-29 NOTE — Progress Notes (Signed)
Pt presented to the nurses station upset and crying. Pt requested prn Ativan for increased anxiety level. Pt endorsed SI with no plan or intent. Pt verbally contracts for safety. Pt expressed that she spoke with her husband and he wants a divorce. Verbal support provided. Pt encouraged to attend nurses group with Elease Hashimoto, Charity fundraiser. Pt agreed to attend group. Writer followed up with pt and pt reported decreased anxiety and expressed that the group was helpful.

## 2018-04-29 NOTE — Plan of Care (Signed)
  Problem: Education: Goal: Emotional status will improve Outcome: Progressing   Problem: Activity: Goal: Interest or engagement in activities will improve Outcome: Progressing   Problem: Safety: Goal: Periods of time without injury will increase Outcome: Progressing   

## 2018-04-30 MED ORDER — SULFAMETHOXAZOLE-TRIMETHOPRIM 800-160 MG PO TABS
1.0000 | ORAL_TABLET | Freq: Two times a day (BID) | ORAL | Status: DC
  Administered 2018-05-01: 1 via ORAL
  Filled 2018-04-30 (×6): qty 1

## 2018-04-30 MED ORDER — SULFAMETHOXAZOLE-TRIMETHOPRIM 800-160 MG PO TABS
1.0000 | ORAL_TABLET | Freq: Once | ORAL | Status: AC
  Administered 2018-04-30: 1 via ORAL
  Filled 2018-04-30: qty 1

## 2018-04-30 MED ORDER — SULFAMETHOXAZOLE-TRIMETHOPRIM 800-160 MG PO TABS: 1.0000 | ORAL_TABLET | Freq: Two times a day (BID) | ORAL | Status: DC

## 2018-04-30 NOTE — Progress Notes (Signed)
Nursing Progress Note: 7p-7a D: Pt currently presents with a depressed/anxious affect and behavior. Pt states "I am nervous about my job. I hope I still have it after I leave here." Interacting appropriately with the milieu. Pt reports good sleep during the previous night with current medication regimen. Pt did attend wrap-up group.  A: Pt provided with medications per providers orders. Pt's labs and vitals were monitored throughout the night. Pt supported emotionally and encouraged to express concerns and questions. Pt educated on medications.  R: Pt's safety ensured with 15 minute and environmental checks. Pt currently denies SI, HI, and AVH. Pt verbally contracts to seek staff if SI,HI, or AVH occurs and to consult with staff before acting on any harmful thoughts. Will continue to monitor.

## 2018-04-30 NOTE — [facility] Group Notes (Signed)
Adult Psychoeducational Group Note  Date:  04/30/2018 Time:  9:43 PM  Group Topic/Focus:  Wrap-Up Group:   The focus of this group is to help patients review their daily goal of treatment and discuss progress on daily workbooks.  Participation Level:  Active  Participation Quality:  Appropriate and Attentive  Affect:  Appropriate  Cognitive:  Alert and Appropriate  Insight: Appropriate and Good  Engagement in Group:  Engaged  Modes of Intervention:  Discussion and Education  Additional Comments:  Pt attended and participated in wrapup group this evening. Pt rated their day a 9/10, due to them being able to talk with people who understand them and support them. Pt completed their goal, which was to stay positive.    Chrisandra Netters 04/30/2018, 9:43 PM

## 2018-04-30 NOTE — [facility] Group Notes (Signed)
LCSW Group Therapy Note 04/30/2018 11:05 AM  Type of Therapy and Topic: Group Therapy: Overcoming Obstacles  Participation Level: Active  Description of Group:  In this group patients will be encouraged to explore what they see as obstacles to their own wellness and recovery. They will be guided to discuss their thoughts, feelings, and behaviors related to these obstacles. The group will process together ways to cope with barriers, with attention given to specific choices patients can make. Each patient will be challenged to identify changes they are motivated to make in order to overcome their obstacles. This group will be process-oriented, with patients participating in exploration of their own experiences as well as giving and receiving support and challenge from other group members.  Therapeutic Goals: 1. Patient will identify personal and current obstacles as they relate to admission. 2. Patient will identify barriers that currently interfere with their wellness or overcoming obstacles.  3. Patient will identify feelings, thought process and behaviors related to these barriers. 4. Patient will identify two changes they are willing to make to overcome these obstacles:   Summary of Patient Progress  [name] was engaged and participated throughout the group session. [name] states that her main obstacle is "guilt and hopelessness". [name] states "I get in my own way. I love to push people away or make them not want to be around me".    Therapeutic Modalities:  Cognitive Behavioral Therapy Solution Focused Therapy Motivational Interviewing Relapse Prevention Therapy   Alcario Drought Clinical Social Worker

## 2018-04-30 NOTE — Progress Notes (Addendum)
[facility] MD Progress Note  04/30/2018 4:08 PM Shannon [name]  MRN:  [id number] Subjective: Patient describes some improvement but acknowledges she remains depressed, anxious, and continues to ruminate about her marital stressors.  States she had a recent phone conversation with her husband which "did not go well", as felt he remained angry and interested in separation.  Currently denies suicidal ideations. Denies medication side effects.   Objective: I have reviewed chart notes and have met with patient. 33 year old married female, presented to hospital voluntarily reporting worsening depression, neurovegetative symptoms of depression, suicidal ideations.  Contributing factors include marital discord/husband wanting to separate . On day of admission she had attempted suicide by carbon monoxide poisoning by sitting inside her idling car until her husband opened the door.  Patient reports some improvement compared to admission but describes lingering/persistent depression which she attributes to marital conflict/stress as above.  She does continue to present with a vaguely anxious/constricted affect although it improves during session, and is noted to improve during interactions with peers as well.  She denies suicidal ideations. Continues to endorse neurovegetative symptoms of depression such as a subjective fair appetite, shallow sleep, low energy level. She denies medication side effects. She describes some increased urinary frequency recently, without other associated symptoms.  She denies dysuria.  States she has been diagnosed with "hyperactive bladder" in the past.   Principal Problem: MDD Diagnosis: Active Problems:   MDD (major depressive disorder), recurrent severe, without psychosis ([place])  Total Time spent with patient: 20 minutes  Past Psychiatric History:   Past Medical History:  Past Medical History:  Diagnosis Date  . Anxiety   . Clenching of teeth    states due to stress   .  Degenerative arthritis of right shoulder region 10/2013  . Depression   . 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 Disorders of bursae and tendons in shoulder region, unspecified 10/2013   right  . Recent upper respiratory tract infection 10/22/2013   states still has some congestion    Past Surgical History:  Procedure Laterality Date  . SHOULDER ARTHROSCOPY WITH DISTAL CLAVICLE RESECTION Right 10/24/2013   Procedure: RIGHT SHOULDER DEBRIDEMENT EXTENSIVE, DISTAL CLAVICULECTOMY, DECOMPRESSION SUBACROMIAL PARTIAL ACROMIOPLASTY WITH DEBRIDEMENT OF LABRIUM;  Surgeon: Ninetta Lights, MD;  Location: [facility];  Service: Orthopedics;  Laterality: Right;  . TONSILLECTOMY    . TUBAL LIGATION  10/19/2011   Procedure: POST PARTUM TUBAL LIGATION;  Surgeon: [name]. Landry Mellow, MD;  Location: [facility];  Service: Gynecology;  Laterality: Bilateral;  . WISDOM TOOTH EXTRACTION     Family History:  Family History  Problem Relation Age of Onset  . Bipolar disorder Mother   . Heart disease Maternal Grandfather   . Hypertension Maternal Grandfather   . Cancer Maternal Grandfather   . Depression Sister   . Anxiety disorder Father   . Anxiety disorder Sister   . Anxiety disorder Brother    Family Psychiatric  History:  Social History:  Social History   Substance and Sexual Activity  Alcohol Use No  . Alcohol/week: 0.0 standard drinks     Social History   Substance and Sexual Activity  Drug Use Yes  . Types: Marijuana    Social History   Socioeconomic History  . Marital status: Married    Spouse name: Not on file  . Number of children: 2  . Years of education: Not on file  . Highest education level: Not on file  Occupational History  .  Occupation: Therapist, art rep for [employer]    Employer: [employer]  Social Needs  . Financial resource strain: Not on file  . Food insecurity:    Worry: Not on file    Inability: Not on file  . Transportation needs:    Medical:  Not on file    Non-medical: Not on file  Tobacco Use  . Smoking status: Former Smoker    Last attempt to quit: 10/13/2009    Years since quitting: 8.5  . Smokeless tobacco: Never Used  Substance and Sexual Activity  . Alcohol use: No    Alcohol/week: 0.0 standard drinks  . Drug use: Yes    Types: Marijuana  . Sexual activity: Yes    Partners: Male    Birth control/protection: Surgical  Lifestyle  . Physical activity:    Days per week: Not on file    Minutes per session: Not on file  . Stress: Not on file  Relationships  . Social connections:    Talks on phone: Not on file    Gets together: Not on file    Attends religious service: Not on file    Active member of club or organization: Not on file    Attends meetings of clubs or organizations: Not on file    Relationship status: Not on file  Other Topics Concern  . Not on file  Social History Narrative   Married, 2 daughters, 1 dog, 1 cat.   Additional Social History:   Sleep: Fair  Appetite:  Fair  Current Medications: Current Facility-Administered Medications  Medication Dose Route Frequency Provider Last Rate Last Dose  . acetaminophen (TYLENOL) tablet 650 mg  650 mg Oral Q6H PRN Ethelene Hal, NP      . alum & mag hydroxide-simeth (MAALOX/MYLANTA) 200-200-20 MG/5ML suspension 30 mL  30 mL Oral Q4H PRN Ethelene Hal, NP      . buPROPion (WELLBUTRIN XL) 24 hr tablet 150 mg  150 mg Oral Daily Cobos, Myer Peer, MD   150 mg at 04/30/18 0820  . citalopram (CELEXA) tablet 40 mg  40 mg Oral QHS Ethelene Hal, NP   40 mg at 04/29/18 2104  . hydrocortisone cream 1 %   Topical TID PRN Lindon Romp A, NP      . LORazepam (ATIVAN) tablet 0.5 mg  0.5 mg Oral Q6H PRN Cobos, Myer Peer, MD   0.5 mg at 04/29/18 1304  . magnesium hydroxide (MILK OF MAGNESIA) suspension 30 mL  30 mL Oral Daily PRN Ethelene Hal, NP      . traZODone (DESYREL) tablet 50 mg  50 mg Oral QHS PRN Ethelene Hal, NP    50 mg at 04/26/18 2113    Lab Results:  Results for orders placed or performed during the hospital encounter of 04/26/18 (from the past 48 hour(s))  HIV Antibody (routine testing w rflx)     Status: None   Collection Time: 04/28/18  6:20 PM  Result Value Ref Range   HIV Screen 4th Generation wRfx Non Reactive Non Reactive    Comment: (NOTE) Performed At: [facility] [address] [place], [place] [id number] Rush Farmer MD WU:[id number]     Blood Alcohol level:  No results found for: [name]  Metabolic Disorder Labs: No results found for: HGBA1C, MPG No results found for: PROLACTIN No results found for: CHOL, TRIG, HDL, CHOLHDL, VLDL, LDLCALC  Physical Findings: AIMS: Facial and Oral Movements Muscles of Facial Expression: None, normal Lips and Perioral  Area: None, normal Jaw: None, normal Tongue: None, normal,Extremity Movements Upper (arms, wrists, hands, fingers): None, normal Lower (legs, knees, ankles, toes): None, normal, Trunk Movements Neck, shoulders, hips: None, normal, Overall Severity Severity of abnormal movements (highest score from questions above): None, normal Incapacitation due to abnormal movements: None, normal Patient's awareness of abnormal movements (rate only patient's report): No Awareness, Dental Status Current problems with teeth and/or dentures?: No Does patient usually wear dentures?: No  CIWA:  CIWA-Ar Total: 0 COWS:  COWS Total Score: 1  Musculoskeletal: Strength & Muscle Tone: within normal limits Gait & Station: normal Patient leans: N/A  Psychiatric Specialty Exam: Physical Exam  ROS no headache, no chest pain, no shortness of breath, urinary urgency, no dysuria, no flank pain, no fever or chills   Blood pressure 99/72, pulse (!) 103, temperature 98.2 F (36.8 C), temperature source Oral, resp. rate 18, height 5' 9"  (1.753 m), weight 78.5 kg, last menstrual period 04/08/2018, SpO2 100 %.Body mass index is 25.55 kg/m.   General Appearance: Well Groomed  Eye Contact:  Good  Speech:  Normal Rate  Volume:  Normal  Mood:  reports lingering depression and anxiety  Affect:  constricted, anxious affect, improves partially during session  Thought Process:  Linear and Descriptions of Associations: Intact  Orientation:  Full (Time, Place, and Person)  Thought Content:  No hallucinations, no delusions, not internally preoccupied  Suicidal Thoughts:  No denies suicidal or self-injurious ideations, denies homicidal or violent ideations, specifically also denies any homicidal or violent ideations towards her husband, contracts for safety on unit  Homicidal Thoughts:  No  Memory:  Recent and remote grossly intact  Judgement:  improving  Insight: Improving  Psychomotor Activity:  Normal  Concentration:  Concentration: Good and Attention Span: Good  Recall:  Good  Fund of Knowledge:  Good  Language:  Good  Akathisia:  Negative  Handed:  Right  AIMS (if indicated):     Assets:  Desire for Improvement Resilience  ADL's:  Intact  Cognition:  WNL  Sleep:  Number of Hours: 6.25   Assessment-  33 year old married female, presented to hospital voluntarily reporting worsening depression, neurovegetative symptoms of depression, suicidal ideations.  Contributing factors include marital discord/husband wanting to separate . On day of admission she had attempted suicide by carbon monoxide poisoning by sitting inside her idling car until her husband opened the door.  She reports persistent depression and in particular states she felt more depressed/discouraged after phone conversation with her husband.  She denies any suicidal ideations, contracts for safety on unit.  Thus far is tolerating Celexa and Wellbutrin XL well, without side effects.  Reports some urinary urgency not associated with dysuria or with fever/chills.   1/11 UA negative for leukocytes, negative for nitrites, rare bacteria. Urine culture -20,000 colonies of  E. coli . Complains of urgency, no fever or chills.  Was started on Bactrim course   Treatment Plan Summary: Daily contact with patient to assess and evaluate symptoms and progress in treatment, Medication management, Plan Inpatient treatment and Medications as below  Encourage group and milieu participation to work on coping skills and symptom reduction. Treatment team working on disposition planning options Continue Wellbutrin XL 150 mg daily for depression Continue Celexa 40 mg daily for depression and anxiety Continue Trazodone 50 mg nightly PRN for insomnia as needed Ativan 0.5 mgr Q 6 hours PRN for anxiety as needed  I have reviewed UA/UCx results with ID consultant . Agrees with short course of Bactrim.  Jenne Campus, MD 04/30/2018, 4:08 PM   Patient ID: Sharol Given, female   DOB: [birth year], 33 y.o.   MRN: [id number]

## 2018-04-30 NOTE — Progress Notes (Addendum)
D:  Patient's self inventory sheet, patient has fair sleep, no sleep medication.  Fair appetite, low energy level, poor concentration.  Rated depression 5, hopeless and anxiety 3.  Denied withdrawals.  Denied SI.  Denied physical problems.  Denied physical pain.  Goal is discharge.  Plans to stay positive.  Does have discharge plans. A:  Medications administered per MD orders.  Emotional support and encouragement given patient. R:  Denied SI and HI, contracts for safety.  Denied A/V hallucinations.  Safety maintained with 15 minute checks.

## 2018-04-30 NOTE — [facility] Suicide Risk Assessment (Signed)
[facility] INPATIENT:  Family/Significant Other Suicide Prevention Education  Suicide Prevention Education:  Education Completed; Shannon [name], husband [phone]) has been identified by the patient as the family member/significant other with whom the patient will be residing, and identified as the person(s) who will aid the patient in the event of a mental health crisis (suicidal ideations/suicide attempt).  With written consent from the patient, the family member/significant other has been provided the following suicide prevention education, prior to the and/or following the discharge of the patient.  The suicide prevention education provided includes the following:  Suicide risk factors  Suicide prevention and interventions  National Suicide Hotline telephone number  [facility] assessment telephone number  [facility] Emergency Assistance 911  [facility] and/or Residential Mobile Crisis Unit telephone number  Request made of family/significant other to:  Remove weapons (e.g., guns, rifles, knives), all items previously/currently identified as safety concern.    Remove drugs/medications (over-the-counter, prescriptions, illicit drugs), all items previously/currently identified as a safety concern.  The family member/significant other verbalizes understanding of the suicide prevention education information provided.  The family member/significant other agrees to remove the items of safety concern listed above.  Shannon [name] 04/30/2018, 3:04 PM

## 2018-04-30 NOTE — Progress Notes (Signed)
Recreation Therapy Notes  Date: 1.13.20 Time: 0930 Location: [address] Dayroom  Group Topic: Stress Management  Goal Area(s) Addresses:  Patient will identify stress management techniques. Patient will identify benefits of using stress management techniques post d/c.  Intervention: Stress Management  Activity :  Meditation.  LRT introduced the stress management technique of meditation.  LRT played [name] meditation on being resilient in the face of adversity.  Patients were to follow along as meditation played to engage in activity.  Education:  Stress Management, Discharge Planning.   Education Outcome: Acknowledges Education  Clinical Observations/Feedback: Pt did not attend group.    Shannon [name], LRT/CTRS         Shannon [name] 04/30/2018 11:58 AM 

## 2018-05-01 LAB — URINE CULTURE
Culture: 20000 — AB
Special Requests: NORMAL

## 2018-05-01 MED ORDER — LORAZEPAM 0.5 MG PO TABS: 0.5000 mg | ORAL_TABLET | Freq: Four times a day (QID) | ORAL | 0 refills | Status: DC | PRN

## 2018-05-01 MED ORDER — CITALOPRAM HYDROBROMIDE 40 MG PO TABS: 40.0000 mg | ORAL_TABLET | Freq: Every day | ORAL | 0 refills | Status: DC

## 2018-05-01 MED ORDER — BUPROPION HCL ER (XL) 150 MG PO TB24: 150.0000 mg | ORAL_TABLET | Freq: Every day | ORAL | 0 refills | Status: DC

## 2018-05-01 MED ORDER — SULFAMETHOXAZOLE-TRIMETHOPRIM 800-160 MG PO TABS: 1.0000 | ORAL_TABLET | Freq: Two times a day (BID) | ORAL | Status: DC

## 2018-05-01 MED ORDER — HYDROCORTISONE 1 % EX CREA: TOPICAL_CREAM | Freq: Three times a day (TID) | CUTANEOUS | 0 refills | Status: DC | PRN

## 2018-05-01 MED ORDER — TRAZODONE HCL 50 MG PO TABS: 50.0000 mg | ORAL_TABLET | Freq: Every evening | ORAL | 0 refills | Status: DC | PRN

## 2018-05-01 NOTE — Progress Notes (Signed)
CSW spoke with the patient's husband, Laterica Yung [phone]). Per Mr. Yaeger, he does not have any concerns with the patient discharging home today. Per Mr. Capehart, he will be the person picking his wife up at discharge.   Dr. Tammy Sours Clary,MD notified Quintella Reichert, RN notified  Baldo Daub, MSW, [facility] Clinical Social Worker [facility]  Phone: [phone]

## 2018-05-01 NOTE — Discharge Summary (Signed)
Physician Discharge Summary Note  Patient:  Shannon [name] is an 33 y.o., female  MRN:  [id number]  DOB:  05/19/1985  Patient phone:  [phone] (home)   Patient address:   [address] [place]  [address],   Total Time spent with patient: Greater than 30 minutes  Date of Admission:  04/26/2018  Date of Discharge: 05-01-18  Reason for Admission: Worsening symptoms of depression with passive SI triggered by marital conflict.  Principal Problem: <principal problem not specified>  Discharge Diagnoses: Active Problems:   MDD (major depressive disorder), recurrent severe, without psychosis (HCC)  Past Psychiatric History: MDD  Past Medical History:  Past Medical History:  Diagnosis Date  . Anxiety   . Clenching of teeth    states due to stress   . Degenerative arthritis of right shoulder region 10/2013  . Depression   . 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 Disorders of bursae and tendons in shoulder region, unspecified 10/2013   right  . Recent upper respiratory tract infection 10/22/2013   states still has some congestion    Past Surgical History:  Procedure Laterality Date  . SHOULDER ARTHROSCOPY WITH DISTAL CLAVICLE RESECTION Right 10/24/2013   Procedure: RIGHT SHOULDER DEBRIDEMENT EXTENSIVE, DISTAL CLAVICULECTOMY, DECOMPRESSION SUBACROMIAL PARTIAL ACROMIOPLASTY WITH DEBRIDEMENT OF LABRIUM;  Surgeon: Loreta Ave, MD;  Location: [facility];  Service: Orthopedics;  Laterality: Right;  . TONSILLECTOMY    . TUBAL LIGATION  10/19/2011   Procedure: POST PARTUM TUBAL LIGATION;  Surgeon: Dorien Chihuahua. Richardson Dopp, MD;  Location: [facility];  Service: Gynecology;  Laterality: Bilateral;  . WISDOM TOOTH EXTRACTION     Family History:  Family History  Problem Relation Age of Onset  . Bipolar disorder Mother   . Heart disease Maternal Grandfather   . Hypertension Maternal Grandfather   . Cancer Maternal Grandfather   . Depression Sister   .  Anxiety disorder Father   . Anxiety disorder Sister   . Anxiety disorder Brother    Family Psychiatric  History: See H&P Social History:  Social History   Substance and Sexual Activity  Alcohol Use No  . Alcohol/week: 0.0 standard drinks     Social History   Substance and Sexual Activity  Drug Use Yes  . Types: Marijuana    Social History   Socioeconomic History  . Marital status: Married    Spouse name: Not on file  . Number of children: 2  . Years of education: Not on file  . Highest education level: Not on file  Occupational History  . Occupation: Clinical biochemist rep for [employer]    Employer: [employer]  Social Needs  . Financial resource strain: Not on file  . Food insecurity:    Worry: Not on file    Inability: Not on file  . Transportation needs:    Medical: Not on file    Non-medical: Not on file  Tobacco Use  . Smoking status: Former Smoker    Last attempt to quit: 10/13/2009    Years since quitting: 8.5  . Smokeless tobacco: Never Used  Substance and Sexual Activity  . Alcohol use: No    Alcohol/week: 0.0 standard drinks  . Drug use: Yes    Types: Marijuana  . Sexual activity: Yes    Partners: Male    Birth control/protection: Surgical  Lifestyle  . Physical activity:    Days per week: Not on file    Minutes per session: Not on file  .  Stress: Not on file  Relationships  . Social connections:    Talks on phone: Not on file    Gets together: Not on file    Attends religious service: Not on file    Active member of club or organization: Not on file    Attends meetings of clubs or organizations: Not on file    Relationship status: Not on file  Other Topics Concern  . Not on file  Social History Narrative   Married, 2 daughters, 1 dog, 1 cat.   Hospital Course: (Per md's admission evaluation): 33 year old married female, presented to ED voluntarily reporting worsening depression. She attributes this at least in part to marital stress, states her husband  has expressed wanting to separate/divorce due to a prior history of infidelity. Patient states this caused her to feel more depressed, overwhelmed. States she has a history of depressive episodes in the past, and has been feeling more depressed over recent weeks. Endorses neuro-vegetative symptoms as below. She reports recent suicidal ideations, which she describes mostly as passive . Denies psychotic symptoms. States that on day of admission she had attempted suicide by CO poisoning by sitting inside her idling car. States " I sat there for half an hour and my husband came and opened the door". She then agreed to come to ED.   Shannon [name] [facility] with complaints of worsening depression triggering passive suicidal ideations. She cited as her trigger, a marital conflict. After evaluation of her presenting symptoms, she was recommended for mood stabilization treatments. She was treated & discharged with the medications as listed below under Medication List. Her other medical problems were identified and treated as well. She was enrolled & participated in the group counseling sessions being offered & held on this unit. She learned coping skills.      During the course of her hospitalization, Shannon [name] was evaluated daily by the treatment team for mood stability & plans for continued recovery after discharge. She was offered further treatment options upon discharge on an outpatient basis as noted below. She is provided with all the necessary information needed to make this appointment without problems.     Upon discharge, Shannon [name] was both mentally & medically stable denying suicidal/homicidal ideation, auditory/visual/tactile hallucinations, delusional thoughts or paranoia. She was able to engage in safety planning including plan [facility] or contact emergency services if she feels unable to maintain her own safety or the safety of others. Pt had no further questions, comments or concerns.  [facility] personal belongings in no apparent distress.   Physical Findings: AIMS: Facial and Oral Movements Muscles of Facial Expression: None, normal Lips and Perioral Area: None, normal Jaw: None, normal Tongue: None, normal,Extremity Movements Upper (arms, wrists, hands, fingers): None, normal Lower (legs, knees, ankles, toes): None, normal, Trunk Movements Neck, shoulders, hips: None, normal, Overall Severity Severity of abnormal movements (highest score from questions above): None, normal Incapacitation due to abnormal movements: None, normal Patient's awareness of abnormal movements (rate only patient's report): No Awareness, Dental Status Current problems with teeth and/or dentures?: No Does patient usually wear dentures?: No  CIWA:  CIWA-Ar Total: 1 COWS:  COWS Total Score: 2  Musculoskeletal: Strength & Muscle Tone: within normal limits Gait & Station: normal Patient leans: N/A  Psychiatric Specialty Exam: Physical Exam  Nursing note and vitals reviewed. Constitutional: She appears well-developed.  HENT:  Head: Normocephalic.  Eyes: Pupils are equal, round, and reactive to light.  Neck: Normal range of motion.  Cardiovascular: Normal rate.  Respiratory: Effort normal.  GI: Soft.  Genitourinary:    Genitourinary Comments: Deferred   Musculoskeletal: Normal range of motion.  Neurological: She is alert.  Skin: Skin is warm.    Review of Systems  Constitutional: Negative.   HENT: Negative.   Eyes: Negative.   Respiratory: Negative.  Negative for cough, shortness of breath and wheezing.   Cardiovascular: Negative.  Negative for chest pain and palpitations.  Gastrointestinal: Negative.  Negative for abdominal pain, heartburn, nausea and vomiting.  Genitourinary: Negative.   Musculoskeletal: Negative.   Skin: Negative.   Neurological: Negative.  Negative for dizziness and headaches.  Endo/Heme/Allergies: Negative.   Psychiatric/Behavioral: Positive  for depression (Stable) and substance abuse (Hx. THC use). Negative for hallucinations, memory loss and suicidal ideas. The patient has insomnia (Stable). The patient is not nervous/anxious (Stable).     Blood pressure 116/87, pulse 95, temperature 98.3 F (36.8 C), temperature source Oral, resp. rate 16, height 5\' 9"  (1.753 m), weight 78.5 kg, last menstrual period 04/08/2018, SpO2 100 %.Body mass index is 25.55 kg/m.  See Md's discharge SRA   Have you used any form of tobacco in the last 30 days? (Cigarettes, Smokeless Tobacco, Cigars, and/or Pipes): No  Has this patient used any form of tobacco in the last 30 days? (Cigarettes, Smokeless Tobacco, Cigars, and/or Pipes): N/A  Blood Alcohol level:  No results found for: [facility]  Metabolic Disorder Labs:  No results found for: HGBA1C, MPG No results found for: PROLACTIN No results found for: CHOL, TRIG, HDL, CHOLHDL, VLDL, LDLCALC  See Psychiatric Specialty Exam and Suicide Risk Assessment completed by Attending Physician prior to discharge.  Discharge destination:  Home  Is patient on multiple antipsychotic therapies at discharge:  No   Has Patient had three or more failed trials of antipsychotic monotherapy by history:  No  Recommended Plan for Multiple Antipsychotic Therapies: NA   Allergies as of 05/01/2018      Reactions   Penicillins Hives   DID THE REACTION INVOLVE: Swelling of the face/tongue/throat, SOB, or low BP? No Sudden or severe rash/hives, skin peeling, or the inside of the mouth or nose? Yes Did it require medical treatment? No When did it last happen?Over 10 years ago If all above answers are "NO", may proceed with cephalosporin use.   Codeine Itching      Medication List    STOP taking these medications   ARIPiprazole 5 MG tablet Commonly known as:  ABILIFY   lamoTRIgine 25 MG tablet Commonly known as:  LAMICTAL   lidocaine 5 % Commonly known as:  LIDODERM   methocarbamol 500 MG tablet Commonly known  as:  ROBAXIN     TAKE these medications     Indication  buPROPion 150 MG 24 hr tablet Commonly known as:  WELLBUTRIN XL Take 1 tablet (150 mg total) by mouth daily. For depression Start taking on:  May 02, 2018  Indication:  Major Depressive Disorder   citalopram 40 MG tablet Commonly known as:  CELEXA Take 1 tablet (40 mg total) by mouth at bedtime. For depression What changed:    when to take this  additional instructions  Indication:  Depression   hydrocortisone cream 1 % Apply topically 3 (three) times daily as needed for itching.  Indication:  Itching   LORazepam 0.5 MG tablet Commonly known as:  ATIVAN Take 1 tablet (0.5 mg total) by mouth every 6 (six) hours as needed for anxiety.  Indication:  Anxiousness associated with Depression   sulfamethoxazole-trimethoprim 800-160 MG tablet Commonly known as:  BACTRIM DS,SEPTRA DS Take 1 tablet by mouth every 12 (twelve) hours. ([facility]): For UTI  Indication:  UTI   traZODone 50 MG tablet Commonly known as:  DESYREL Take 1 tablet (50 mg total) by mouth at bedtime as needed for sleep.  Indication:  Trouble Sleeping      Follow-up Information    [facility] Go on 05/14/2018.   Why:  Your therapy appointment is Monday, 1/27 at 10:00a.  Please complete all paperwork that was emailed to you before your appointment.  Please bring: photo ID, proof of insurance, and any discharge paperwork from this hospitalization.  Contact information: [address] [place] [address] [phone]        [facility] Go on [facility]/2018.   Why:  Your medication management appointment is Tuesday, 1/21 at 9:00a. Please bring your photo ID, proof of insurance, current medications, and any discharge paperwork.  Contact information: [address] [place], [place] [address] Phone: [phone] Fax: [phone]         Follow-up recommendations: Activity:  As  tolerated Diet: As recommended by your primary care doctor. Keep all scheduled follow-up appointments as recommended.   Comments: Patient is instructed prior to discharge to: Take all medications as prescribed by his/her mental healthcare provider. Report any adverse effects and or reactions from the medicines to his/her outpatient provider promptly. Patient has been instructed & cautioned: To not engage in alcohol and or illegal drug use while on prescription medicines. In the event of worsening symptoms, patient is instructed to call the crisis hotline, 911 and or go to the nearest ED for appropriate evaluation and treatment of symptoms. To follow-up with his/her primary care provider for your other medical issues, concerns and or health care needs.   Signed: Armandina StammerAgnes Nwoko, NP, PMHNP, FNP-BC 05/01/2018, 11:22 AM

## 2018-05-01 NOTE — Progress Notes (Signed)
D:  Patient's self inventory sheet, patient sleeps good, no sleep medication.  Good appetite, normal energy level, good concentration.  Denied depression and hopeless, rated anxiety 1.  Denied withdrawals.  Denied SI.  Denied physical problems.  Denied physical pain.  Goal is discharge.  Plans to have family counseling. A:  Medications administered per MD orders.  Emotional support and encouragement given patient. R:  Denied SI and HI, contracts for safety.  Denied A/V hallucinations.  Safety maintained with 15 minute checks.

## 2018-05-01 NOTE — Progress Notes (Signed)
Discharge Note:  Patient discharged home with family member.  Patient denied SI and HI.  Denied A/V hallucinations.  Suicide prevention information given and discussed with patient who stated she understood and had no questions.  My3 App also given to patient.  Survey given to patient.  Patient stated she received all her belongings, clothing, misc items, toiletries, etc.  Patient stated she appreciated all assistance received from [facility] staff.  All required discharge information given to patient at discharge.   

## 2018-05-01 NOTE — [facility] Suicide Risk Assessment (Signed)
[facility]   Principal Problem: <principal problem not specified> Discharge Diagnoses: Active Problems:   MDD (major depressive disorder), recurrent severe, without psychosis (HCC)   Total Time spent with patient: 20 minutes  Musculoskeletal: Strength & Muscle Tone: within normal limits Gait & Station: normal Patient leans: N/A  Psychiatric Specialty Exam: Review of Systems  All other systems reviewed and are negative.   Blood pressure 116/87, pulse 95, temperature 98.3 F (36.8 C), temperature source Oral, resp. rate 16, height 5\' 9"  (1.753 m), weight 78.5 kg, last menstrual period 04/08/2018, SpO2 100 %.Body mass index is 25.55 kg/m.  General Appearance: Casual  Eye Contact::  Fair  Speech:  Normal Rate409  Volume:  Normal  Mood:  Anxious  Affect:  Congruent  Thought Process:  Coherent and Descriptions of Associations: Intact  Orientation:  Full (Time, Place, and Person)  Thought Content:  Logical  Suicidal Thoughts:  No  Homicidal Thoughts:  No  Memory:  Immediate;   Fair Recent;   Fair Remote;   Fair  Judgement:  Intact  Insight:  Fair  Psychomotor Activity:  Normal  Concentration:  Good  Recall:  Fair  Fund of Knowledge:Good  Language: Good  Akathisia:  Negative  Handed:  Right  AIMS (if indicated):     Assets:  Communication Skills Desire for Improvement Financial Resources/Insurance Housing Physical Health Resilience Talents/Skills  Sleep:  Number of Hours: 6.75  Cognition: WNL  ADL's:  Intact   Mental Status Per Nursing Assessment::   On Admission:  Suicidal ideation indicated by patient, Self-harm behaviors  Demographic Factors:  Caucasian  Loss Factors: Loss of significant relationship  Historical Factors: Impulsivity  Risk Reduction Factors:   Sense of responsibility to family, Employed and Positive coping skills or problem solving skills  Continued Clinical Symptoms:  Depression:   Impulsivity  Cognitive  Features That Contribute To Risk:  None    Suicide Risk:  Minimal: No identifiable suicidal ideation.  Patients presenting with no risk factors but with morbid ruminations; may be classified as minimal risk based on the severity of the depressive symptoms  Follow-up Information    [facility] Go on 05/14/2018.   Why:  Your therapy appointment is Monday, 1/27 at 10:00a.  Please complete all paperwork that was emailed to you before your appointment.  Please bring: photo ID, proof of insurance, and any discharge paperwork from this hospitalization.  Contact information: [address] [place] [address] [phone]        [facility]. Go on 05/08/2018.   Why:  Your medication management appointment is Tuesday, 1/21 at 9:00a. Please bring your photo ID, proof of insurance, current medications, and any discharge paperwork.  Contact information: [address] [place], [place] [address] Phone: [phone] Fax: [phone]          Plan Of Care/Follow-up recommendations:  Activity:  ad lib  Antonieta Pert, MD 05/01/2018, 11:02 AM

## 2019-02-14 ENCOUNTER — Emergency Department (HOSPITAL_COMMUNITY)
Admission: EM | Admit: 2019-02-14 | Discharge: 2019-02-14 | Disposition: A | Discharge status: Home / Self Care | Payer: No Typology Code available for payment source | Attending: Emergency Medicine | Admitting: Emergency Medicine

## 2019-02-14 ENCOUNTER — Emergency Department (HOSPITAL_COMMUNITY): Payer: No Typology Code available for payment source

## 2019-02-14 ENCOUNTER — Other Ambulatory Visit: Payer: Self-pay

## 2019-02-14 ENCOUNTER — Encounter (HOSPITAL_COMMUNITY): Payer: Self-pay | Admitting: Emergency Medicine

## 2019-02-14 DIAGNOSIS — N390 Urinary tract infection, site not specified: Secondary | ICD-10-CM | POA: Diagnosis not present

## 2019-02-14 DIAGNOSIS — N200 Calculus of kidney: Secondary | ICD-10-CM | POA: Diagnosis not present

## 2019-02-14 DIAGNOSIS — Z87891 Personal history of nicotine dependence: Secondary | ICD-10-CM | POA: Diagnosis not present

## 2019-02-14 DIAGNOSIS — Z79899 Other long term (current) drug therapy: Secondary | ICD-10-CM | POA: Diagnosis not present

## 2019-02-14 DIAGNOSIS — R1030 Lower abdominal pain, unspecified: Secondary | ICD-10-CM | POA: Diagnosis present

## 2019-02-14 LAB — URINALYSIS, ROUTINE W REFLEX MICROSCOPIC
Bilirubin Urine: NEGATIVE
Glucose, UA: 50 mg/dL — AB
Ketones, ur: 20 mg/dL — AB
Nitrite: NEGATIVE
Protein, ur: 100 mg/dL — AB
Specific Gravity, Urine: 1.015 (ref 1.005–1.030)
WBC, UA: >50 WBC/hpf — ABNORMAL HIGH (ref 0–5)
pH: 8 (ref 5.0–8.0)

## 2019-02-14 LAB — COMPREHENSIVE METABOLIC PANEL
ALT: 26 U/L (ref 0–44)
AST: 25 U/L (ref 15–41)
Albumin: 4.3 g/dL (ref 3.5–5.0)
Alkaline Phosphatase: 55 U/L (ref 38–126)
Anion gap: 13 (ref 5–15)
BUN: 13 mg/dL (ref 6–20)
CO2: 18 mmol/L — ABNORMAL LOW (ref 22–32)
Calcium: 9.6 mg/dL (ref 8.9–10.3)
Chloride: 106 mmol/L (ref 98–111)
Creatinine, Ser: 1.05 mg/dL — ABNORMAL HIGH (ref 0.44–1.00)
GFR calc Af Amer: >60 mL/min (ref >60)
GFR calc non Af Amer: >60 mL/min (ref >60)
Glucose, Bld: 166 mg/dL — ABNORMAL HIGH (ref 70–99)
Potassium: 3.8 mmol/L (ref 3.5–5.1)
Sodium: 137 mmol/L (ref 135–145)
Total Bilirubin: 1.2 mg/dL (ref 0.3–1.2)
Total Protein: 7.7 g/dL (ref 6.5–8.1)

## 2019-02-14 LAB — CBC
HCT: 39.7 % (ref 36.0–46.0)
Hemoglobin: 13.2 g/dL (ref 12.0–15.0)
MCH: 31.4 pg (ref 26.0–34.0)
MCHC: 33.2 g/dL (ref 30.0–36.0)
MCV: 94.3 fL (ref 80.0–100.0)
Platelets: 402 10*3/uL — ABNORMAL HIGH (ref 150–400)
RBC: 4.21 MIL/uL (ref 3.87–5.11)
RDW: 12 % (ref 11.5–15.5)
WBC: 33.6 10*3/uL — ABNORMAL HIGH (ref 4.0–10.5)
nRBC: 0 % (ref 0.0–0.2)

## 2019-02-14 LAB — I-STAT BETA HCG BLOOD, ED (MC, WL, AP ONLY): I-stat hCG, quantitative: <5 m[IU]/mL (ref <5)

## 2019-02-14 LAB — LIPASE, BLOOD: Lipase: 32 U/L (ref 11–51)

## 2019-02-14 MED ORDER — IBUPROFEN 800 MG PO TABS: 800.0000 mg | ORAL_TABLET | Freq: Three times a day (TID) | ORAL | 0 refills | Status: AC | PRN

## 2019-02-14 MED ORDER — ONDANSETRON HCL 4 MG/2ML IJ SOLN
4.0000 mg | Freq: Once | INTRAMUSCULAR | Status: AC
  Administered 2019-02-14: 19:00:00 4 mg via INTRAVENOUS
  Filled 2019-02-14: qty 2

## 2019-02-14 MED ORDER — PROMETHAZINE HCL 25 MG PO TABS: 25.0000 mg | ORAL_TABLET | Freq: Three times a day (TID) | ORAL | 0 refills | Status: AC | PRN

## 2019-02-14 MED ORDER — SODIUM CHLORIDE (PF) 0.9 % IJ SOLN
INTRAMUSCULAR | Status: AC
  Administered 2019-02-14: 21:00:00
  Filled 2019-02-14: qty 50

## 2019-02-14 MED ORDER — CIPROFLOXACIN HCL 500 MG PO TABS: 500.0000 mg | ORAL_TABLET | Freq: Two times a day (BID) | ORAL | 0 refills | Status: DC

## 2019-02-14 MED ORDER — MORPHINE SULFATE (PF) 4 MG/ML IV SOLN
4.0000 mg | Freq: Once | INTRAVENOUS | Status: AC
  Administered 2019-02-14: 4 mg via INTRAVENOUS
  Filled 2019-02-14: qty 1

## 2019-02-14 MED ORDER — HYDROCODONE-ACETAMINOPHEN 5-325 MG PO TABS: 1.0000 | ORAL_TABLET | Freq: Four times a day (QID) | ORAL | 0 refills | Status: AC | PRN

## 2019-02-14 MED ORDER — KETOROLAC TROMETHAMINE 30 MG/ML IJ SOLN
30.0000 mg | Freq: Once | INTRAMUSCULAR | Status: AC
  Administered 2019-02-14: 30 mg via INTRAVENOUS
  Filled 2019-02-14: qty 1

## 2019-02-14 MED ORDER — IOHEXOL 300 MG/ML  SOLN
100.0000 mL | Freq: Once | INTRAMUSCULAR | Status: AC | PRN
  Administered 2019-02-14: 100 mL via INTRAVENOUS

## 2019-02-14 MED ORDER — SODIUM CHLORIDE 0.9 % IV BOLUS
1000.0000 mL | Freq: Once | INTRAVENOUS | Status: AC
  Administered 2019-02-14: 1000 mL via INTRAVENOUS

## 2019-02-14 NOTE — ED Provider Notes (Signed)
[facility] DEPT Provider Note   CSN: [id number] Arrival date & time: 02/14/19  1230     History   Chief Complaint Chief Complaint  Patient presents with  . Abdominal Pain  . Emesis    HPI Shannon [name] is a 33 y.o. female.     HPI Patient presents to the emergency department with lower abdominal discomfort that started earlier today with nausea and vomiting.  Patient states that the pain seems to be mainly left lower abdominal discomfort.  Patient states that nothing seems to make the condition better but palpation makes the pain worse.  Patient did not take any medications prior to arrival for her symptoms.  The patient denies chest pain, shortness of breath, headache,blurred vision, neck pain, fever, cough, weakness, numbness, dizziness, anorexia, edema,diarrhea, rash, back pain, dysuria, hematemesis, bloody stool, near syncope, or syncope. Past Medical History:  Diagnosis Date  . Anxiety   . Clenching of teeth    states due to stress   . Degenerative arthritis of right shoulder region 10/2013  . Depression   . 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 Disorders of bursae and tendons in shoulder region, unspecified 10/2013   right  . Recent upper respiratory tract infection 10/22/2013   states still has some congestion    Patient Active Problem List   Diagnosis Date Noted  . MDD (major depressive disorder), recurrent severe, without psychosis ([place]) 04/26/2018  . Sprain of calcaneofibular ligament of right ankle 05/30/2016  . Closed nondisplaced fracture of fifth right metatarsal bone 05/30/2016  . Depression, major, recurrent, moderate ([place]) 05/06/2014  . Anxiety state, unspecified 07/18/2012  . Neck pain 07/18/2012  . Jaw pain 07/18/2012    Past Surgical History:  Procedure Laterality Date  . SHOULDER ARTHROSCOPY WITH DISTAL CLAVICLE RESECTION Right 10/24/2013   Procedure: RIGHT SHOULDER DEBRIDEMENT EXTENSIVE,  DISTAL CLAVICULECTOMY, DECOMPRESSION SUBACROMIAL PARTIAL ACROMIOPLASTY WITH DEBRIDEMENT OF LABRIUM;  Surgeon: Ninetta Lights, MD;  Location: [facility];  Service: Orthopedics;  Laterality: Right;  . TONSILLECTOMY    . TUBAL LIGATION  10/19/2011   Procedure: POST PARTUM TUBAL LIGATION;  Surgeon: [name]. Landry Mellow, MD;  Location: [facility];  Service: Gynecology;  Laterality: Bilateral;  . WISDOM TOOTH EXTRACTION       OB History    Gravida  3   Para  2   Term  2   Preterm      AB  1   Living  2     SAB  1   TAB  0   Ectopic  0   Multiple  0   Live Births  2            Home Medications    Prior to Admission medications   Medication Sig Start Date End Date Taking? Authorizing Provider  Acetaminophen-Caff-Pyrilamine (MIDOL COMPLETE PO) Take 1 tablet by mouth 2 (two) times daily as needed (PMS symptoms).   Yes [provider]  buPROPion (WELLBUTRIN XL) 150 MG 24 hr tablet Take 1 tablet (150 mg total) by mouth daily. For depression Patient not taking: Reported on 02/14/2019 05/02/18   Lindell Spar I, NP  citalopram (CELEXA) 40 MG tablet Take 1 tablet (40 mg total) by mouth at bedtime. For depression Patient not taking: Reported on 02/14/2019 05/01/18   Lindell Spar I, NP  hydrocortisone cream 1 % Apply topically 3 (three) times daily as needed for itching. Patient not taking: Reported on 02/14/2019 05/01/18  Armandina Stammer I, NP  LORazepam (ATIVAN) 0.5 MG tablet Take 1 tablet (0.5 mg total) by mouth every 6 (six) hours as needed for anxiety. Patient not taking: Reported on 02/14/2019 05/01/18   Armandina Stammer I, NP  sulfamethoxazole-trimethoprim (BACTRIM DS,SEPTRA DS) 800-160 MG tablet Take 1 tablet by mouth every 12 (twelve) hours. (Will be provided by the [facility] pharmacy): For UTI Patient not taking: Reported on 02/14/2019 05/01/18   Armandina Stammer I, NP  traZODone (DESYREL) 50 MG tablet Take 1 tablet (50 mg total) by mouth at bedtime as needed for sleep. Patient not  taking: Reported on 02/14/2019 05/01/18   Sanjuana Kava, NP    Family History Family History  Problem Relation Age of Onset  . Bipolar disorder Mother   . Heart disease Maternal Grandfather   . Hypertension Maternal Grandfather   . Cancer Maternal Grandfather   . Depression Sister   . Anxiety disorder Father   . Anxiety disorder Sister   . Anxiety disorder Brother     Social History Social History   Tobacco Use  . Smoking status: Former Smoker    Quit date: 10/13/2009    Years since quitting: 9.3  . Smokeless tobacco: Never Used  Substance Use Topics  . Alcohol use: No    Alcohol/week: 0.0 standard drinks  . Drug use: Yes    Types: Marijuana     Allergies   Penicillins and Codeine   Review of Systems Review of Systems All other systems negative except as documented in the HPI. All pertinent positives and negatives as reviewed in the HPI.  Physical Exam Updated Vital Signs BP (!) 103/91   Pulse (!) 118   Temp 98.1 F (36.7 C) (Oral)   Resp 19   SpO2 99%   Physical Exam Vitals signs and nursing note reviewed.  Constitutional:      General: She is not in acute distress.    Appearance: She is well-developed.  HENT:     Head: Normocephalic and atraumatic.  Eyes:     Pupils: Pupils are equal, round, and reactive to light.  Neck:     Musculoskeletal: Normal range of motion and neck supple.  Cardiovascular:     Rate and Rhythm: Normal rate and regular rhythm.     Heart sounds: Normal heart sounds. No murmur. No friction rub. No gallop.   Pulmonary:     Effort: Pulmonary effort is normal. No respiratory distress.     Breath sounds: Normal breath sounds. No wheezing.  Abdominal:     General: Bowel sounds are normal. There is no distension.     Palpations: Abdomen is soft.     Tenderness: There is abdominal tenderness in the suprapubic area and left lower quadrant.  Skin:    General: Skin is warm and dry.     Capillary Refill: Capillary refill takes less  than 2 seconds.     Findings: No erythema or rash.  Neurological:     Mental Status: She is alert and oriented to person, place, and time.     Motor: No abnormal muscle tone.     Coordination: Coordination normal.  Psychiatric:        Behavior: Behavior normal.      ED Treatments / Results  Labs (all labs ordered are listed, but only abnormal results are displayed) Labs Reviewed  COMPREHENSIVE METABOLIC PANEL - Abnormal; Notable for the following components:      Result Value   CO2 18 (*)    Glucose,  Bld 166 (*)    Creatinine, Ser 1.05 (*)    All other components within normal limits  CBC - Abnormal; Notable for the following components:   WBC 33.6 (*)    Platelets 402 (*)    All other components within normal limits  URINALYSIS, ROUTINE W REFLEX MICROSCOPIC - Abnormal; Notable for the following components:   APPearance HAZY (*)    Glucose, UA 50 (*)    Hgb urine dipstick SMALL (*)    Ketones, ur 20 (*)    Protein, ur 100 (*)    Leukocytes,Ua SMALL (*)    WBC, UA >50 (*)    Bacteria, UA MANY (*)    All other components within normal limits  LIPASE, BLOOD  I-STAT BETA HCG BLOOD, ED ([facility], [facility], [facility] ONLY)    EKG EKG Interpretation  Date/Time:  Thursday February 14 2019 18:38:07 EDT Ventricular Rate:  114 PR Interval:    QRS Duration: 72 QT Interval:  331 QTC Calculation: 456 R Axis:   104 Text Interpretation: Right and left arm electrode reversal, interpretation assumes no reversal Sinus tachycardia Borderline right axis deviation Abnormal lateral Q waves Abnormal T, consider ischemia, lateral leads No STEMI Confirmed by Alvester Chou [phone]) on 02/14/2019 7:41:13 PM   Radiology Ct Abdomen Pelvis W Contrast  Result Date: 02/14/2019 CLINICAL DATA:  Acute onset abdominal pain and nausea and vomiting today. EXAM: CT ABDOMEN AND PELVIS WITH CONTRAST TECHNIQUE: Multidetector CT imaging of the abdomen and pelvis was performed using the standard protocol following bolus  administration of intravenous contrast. CONTRAST:  OMNIPAQUE IOHEXOL 300 MG/ML  SOLN COMPARISON:  None. FINDINGS: Lower Chest: No acute findings. Hepatobiliary: No hepatic masses identified. Gallbladder is unremarkable. No evidence of biliary ductal dilatation. Pancreas:  No mass or inflammatory changes. Spleen: Within normal limits in size and appearance. Adrenals/Urinary Tract: Small less than 5 mm renal calculi are seen in both kidneys. Mild right hydroureteronephrosis and perinephric stranding is seen. No ureteral calculi are identified, however a 3 mm calculus is seen in the urinary bladder, consistent with a recently passed calculus. A sub-cm right renal cyst is noted, however there is no evidence of renal mass or abscess. Stomach/Bowel: No evidence of obstruction, inflammatory process or abnormal fluid collections. Diverticulosis is seen mainly involving the descending and sigmoid colon, however there is no evidence of diverticulitis. Vascular/Lymphatic: No pathologically enlarged lymph nodes. No abdominal aortic aneurysm. Reproductive: No mass or other significant abnormality. Clips from previous bilateral tubal ligation noted. Other:  None. Musculoskeletal:  No suspicious bone lesions identified. IMPRESSION: Mild right hydroureteronephrosis and perinephric stranding. 3 mm calculus in urinary bladder, consistent with recently passed ureteral calculus. Bilateral nephrolithiasis. Colonic diverticulosis, without radiographic evidence of diverticulitis. Electronically Signed   By: Danae Orleans M.D.   On: 02/14/2019 19:58    Procedures Procedures (including critical care time)  Medications Ordered in ED Medications  ondansetron (ZOFRAN) injection 4 mg (4 mg Intravenous Given 02/14/19 1832)  sodium chloride 0.9 % bolus 1,000 mL (1,000 mLs Intravenous Bolus 02/14/19 1837)  sodium chloride (PF) 0.9 % injection (  Given by Other 02/14/19 2055)  iohexol (OMNIPAQUE) 300 MG/ML solution 100 mL (100 mLs  Intravenous Contrast Given 02/14/19 1941)  morphine 4 MG/ML injection 4 mg (4 mg Intravenous Given 02/14/19 2022)  ketorolac (TORADOL) 30 MG/ML injection 30 mg (30 mg Intravenous Given 02/14/19 2022)     Initial Impression / Assessment and Plan / ED Course  I have reviewed the triage vital signs and  the nursing notes.  Pertinent labs & imaging results that were available during my care of the patient were reviewed by me and considered in my medical decision making (see chart for details).  Clinical Course as of Feb 13 2105  Thu Feb 14, 2019  [id number] CT Abdomen Pelvis W Contrast [CA]  2008 CT Abdomen Pelvis W Contrast [CA]    Clinical Course User Index [CA] Russ Haloddis, Callista R, [place]      The patient CT scan shows that she has what appears to be acute kidney stone that is passed.  We will treat her with antibiotics as well.  Is feeling better following medications.  She is currently pain-free.  She was given IV fluids as well.  Patient advised to return here for any worsening in her condition.  We will give her follow-up with urology as needed. Final Clinical Impressions(s) / ED Diagnoses   Final diagnoses:  None    ED Discharge Orders    None       Charlestine NightLawyer, Haston Casebolt, Cordelia Poche-C 02/14/19 2125    Terald Sleeperrifan, Matthew J, MD 02/15/19 1400

## 2019-02-14 NOTE — ED Triage Notes (Signed)
Pt reports abd pains with n/v today. Thought was related to her menstrual cycle so took Midol but not helping.

## 2019-02-14 NOTE — ED Notes (Signed)
Provided pt with ginger ale per approval from [place], [place].

## 2019-02-14 NOTE — ED Notes (Signed)
Pt transported to CT ?

## 2019-02-14 NOTE — Discharge Instructions (Addendum)
Return here as needed.  Follow-up with the urologist provided.  Slowly increase your fluid intake.

## 2019-02-14 NOTE — ED Notes (Signed)
Patient refused blood draw with concern of syncopal episode.

## 2019-02-20 ENCOUNTER — Inpatient Hospital Stay (HOSPITAL_COMMUNITY): Payer: No Typology Code available for payment source | Admitting: Anesthesiology

## 2019-02-20 ENCOUNTER — Encounter (HOSPITAL_COMMUNITY): Payer: Self-pay | Admitting: *Deleted

## 2019-02-20 ENCOUNTER — Other Ambulatory Visit: Payer: Self-pay | Admitting: Urology

## 2019-02-20 ENCOUNTER — Inpatient Hospital Stay (HOSPITAL_COMMUNITY): Payer: No Typology Code available for payment source

## 2019-02-20 ENCOUNTER — Other Ambulatory Visit: Payer: Self-pay

## 2019-02-20 ENCOUNTER — Encounter (HOSPITAL_COMMUNITY)
Admission: RE | Disposition: A | Discharge status: Home / Self Care | Payer: Self-pay | Source: Other Acute Inpatient Hospital | Attending: Urology

## 2019-02-20 ENCOUNTER — Ambulatory Visit (HOSPITAL_COMMUNITY)
Admission: RE | Admit: 2019-02-20 | Discharge: 2019-02-20 | Disposition: A | Discharge status: Home / Self Care | Payer: No Typology Code available for payment source | Source: Other Acute Inpatient Hospital | Attending: Urology | Admitting: Urology

## 2019-02-20 DIAGNOSIS — N3030 Trigonitis without hematuria: Secondary | ICD-10-CM | POA: Insufficient documentation

## 2019-02-20 DIAGNOSIS — Z880 Allergy status to penicillin: Secondary | ICD-10-CM | POA: Diagnosis not present

## 2019-02-20 DIAGNOSIS — Z20828 Contact with and (suspected) exposure to other viral communicable diseases: Secondary | ICD-10-CM | POA: Insufficient documentation

## 2019-02-20 DIAGNOSIS — M1990 Unspecified osteoarthritis, unspecified site: Secondary | ICD-10-CM | POA: Insufficient documentation

## 2019-02-20 DIAGNOSIS — Z79899 Other long term (current) drug therapy: Secondary | ICD-10-CM | POA: Diagnosis not present

## 2019-02-20 DIAGNOSIS — N132 Hydronephrosis with renal and ureteral calculous obstruction: Secondary | ICD-10-CM | POA: Insufficient documentation

## 2019-02-20 DIAGNOSIS — Z791 Long term (current) use of non-steroidal anti-inflammatories (NSAID): Secondary | ICD-10-CM | POA: Diagnosis not present

## 2019-02-20 DIAGNOSIS — F419 Anxiety disorder, unspecified: Secondary | ICD-10-CM | POA: Insufficient documentation

## 2019-02-20 DIAGNOSIS — F329 Major depressive disorder, single episode, unspecified: Secondary | ICD-10-CM | POA: Diagnosis not present

## 2019-02-20 DIAGNOSIS — N3020 Other chronic cystitis without hematuria: Secondary | ICD-10-CM | POA: Insufficient documentation

## 2019-02-20 DIAGNOSIS — Z87891 Personal history of nicotine dependence: Secondary | ICD-10-CM | POA: Insufficient documentation

## 2019-02-20 DIAGNOSIS — R61 Generalized hyperhidrosis: Secondary | ICD-10-CM | POA: Diagnosis not present

## 2019-02-20 DIAGNOSIS — Z87440 Personal history of urinary (tract) infections: Secondary | ICD-10-CM | POA: Diagnosis not present

## 2019-02-20 DIAGNOSIS — Z841 Family history of disorders of kidney and ureter: Secondary | ICD-10-CM | POA: Diagnosis not present

## 2019-02-20 DIAGNOSIS — N201 Calculus of ureter: Secondary | ICD-10-CM

## 2019-02-20 HISTORY — DX: Personal history of urinary calculi: Z87.442

## 2019-02-20 HISTORY — PX: CYSTOSCOPY/URETEROSCOPY/HOLMIUM LASER/STENT PLACEMENT: SHX6546

## 2019-02-20 LAB — SARS CORONAVIRUS 2 BY RT PCR (HOSPITAL ORDER, PERFORMED IN ~~LOC~~ HOSPITAL LAB): SARS Coronavirus 2: NEGATIVE

## 2019-02-20 SURGERY — CYSTOSCOPY/URETEROSCOPY/HOLMIUM LASER/STENT PLACEMENT
Anesthesia: General | Site: Ureter | Laterality: Right

## 2019-02-20 MED ORDER — MIDAZOLAM HCL 2 MG/2ML IJ SOLN
INTRAMUSCULAR | Status: AC
  Filled 2019-02-20: qty 2

## 2019-02-20 MED ORDER — TRIMETHOPRIM 100 MG PO TABS: 100.0000 mg | ORAL_TABLET | Freq: Every day | ORAL | 2 refills | Status: DC

## 2019-02-20 MED ORDER — ACETAMINOPHEN 650 MG RE SUPP
650.0000 mg | RECTAL | Status: DC | PRN
  Filled 2019-02-20: qty 1

## 2019-02-20 MED ORDER — IOHEXOL 300 MG/ML  SOLN
INTRAMUSCULAR | Status: DC | PRN
  Administered 2019-02-20: 5 mL

## 2019-02-20 MED ORDER — MORPHINE SULFATE (PF) 4 MG/ML IV SOLN: 2.0000 mg | INTRAVENOUS | Status: DC | PRN

## 2019-02-20 MED ORDER — LIDOCAINE 2% (20 MG/ML) 5 ML SYRINGE
INTRAMUSCULAR | Status: DC | PRN
  Administered 2019-02-20: 100 mg via INTRAVENOUS

## 2019-02-20 MED ORDER — SODIUM CHLORIDE 0.9% FLUSH: 3.0000 mL | INTRAVENOUS | Status: DC | PRN

## 2019-02-20 MED ORDER — SODIUM CHLORIDE 0.9 % IV SOLN: 250.0000 mL | INTRAVENOUS | Status: DC | PRN

## 2019-02-20 MED ORDER — SODIUM CHLORIDE 0.9% FLUSH: 3.0000 mL | Freq: Two times a day (BID) | INTRAVENOUS | Status: DC

## 2019-02-20 MED ORDER — FENTANYL CITRATE (PF) 100 MCG/2ML IJ SOLN: 25.0000 ug | INTRAMUSCULAR | Status: DC | PRN

## 2019-02-20 MED ORDER — ONDANSETRON HCL 4 MG/2ML IJ SOLN
INTRAMUSCULAR | Status: DC | PRN
  Administered 2019-02-20: 4 mg via INTRAVENOUS

## 2019-02-20 MED ORDER — PROPOFOL 10 MG/ML IV BOLUS
INTRAVENOUS | Status: DC | PRN
  Administered 2019-02-20: 200 mg via INTRAVENOUS

## 2019-02-20 MED ORDER — SODIUM CHLORIDE 0.9 % IR SOLN
Status: DC | PRN
  Administered 2019-02-20: 3000 mL via INTRAVESICAL

## 2019-02-20 MED ORDER — LACTATED RINGERS IV SOLN
INTRAVENOUS | Status: DC
  Administered 2019-02-20: 18:00:00 via INTRAVENOUS

## 2019-02-20 MED ORDER — FENTANYL CITRATE (PF) 100 MCG/2ML IJ SOLN
INTRAMUSCULAR | Status: AC
  Filled 2019-02-20: qty 2

## 2019-02-20 MED ORDER — FENTANYL CITRATE (PF) 100 MCG/2ML IJ SOLN
INTRAMUSCULAR | Status: DC | PRN
  Administered 2019-02-20 (×2): 50 ug via INTRAVENOUS

## 2019-02-20 MED ORDER — LIDOCAINE 2% (20 MG/ML) 5 ML SYRINGE
INTRAMUSCULAR | Status: AC
  Filled 2019-02-20: qty 5

## 2019-02-20 MED ORDER — ONDANSETRON HCL 4 MG/2ML IJ SOLN
INTRAMUSCULAR | Status: AC
  Filled 2019-02-20: qty 2

## 2019-02-20 MED ORDER — DEXAMETHASONE SODIUM PHOSPHATE 10 MG/ML IJ SOLN
INTRAMUSCULAR | Status: DC | PRN
  Administered 2019-02-20: 4 mg via INTRAVENOUS

## 2019-02-20 MED ORDER — CIPROFLOXACIN IN D5W 400 MG/200ML IV SOLN
400.0000 mg | INTRAVENOUS | Status: AC
  Administered 2019-02-20: 400 mg via INTRAVENOUS
  Filled 2019-02-20: qty 200

## 2019-02-20 MED ORDER — DEXAMETHASONE SODIUM PHOSPHATE 10 MG/ML IJ SOLN
INTRAMUSCULAR | Status: AC
  Filled 2019-02-20: qty 1

## 2019-02-20 MED ORDER — ACETAMINOPHEN 325 MG PO TABS: 650.0000 mg | ORAL_TABLET | ORAL | Status: DC | PRN

## 2019-02-20 MED ORDER — MIDAZOLAM HCL 5 MG/5ML IJ SOLN
INTRAMUSCULAR | Status: DC | PRN
  Administered 2019-02-20: 2 mg via INTRAVENOUS

## 2019-02-20 MED ORDER — OXYCODONE HCL 5 MG PO TABS: 5.0000 mg | ORAL_TABLET | ORAL | Status: DC | PRN

## 2019-02-20 MED ORDER — PROPOFOL 10 MG/ML IV BOLUS
INTRAVENOUS | Status: AC
  Filled 2019-02-20: qty 20

## 2019-02-20 SURGICAL SUPPLY — 23 items
BAG URO CATCHER STRL LF (MISCELLANEOUS) ×3 IMPLANT
BASKET STONE NCOMPASS (UROLOGICAL SUPPLIES) IMPLANT
CATH URET 5FR 28IN OPEN ENDED (CATHETERS) IMPLANT
CATH URET DUAL LUMEN 6-10FR 50 (CATHETERS) ×3 IMPLANT
CLOTH BEACON ORANGE TIMEOUT ST (SAFETY) ×3 IMPLANT
EXTRACTOR STONE NITINOL NGAGE (UROLOGICAL SUPPLIES) ×3 IMPLANT
FIBER LASER FLEXIVA 1000 (UROLOGICAL SUPPLIES) IMPLANT
FIBER LASER FLEXIVA 365 (UROLOGICAL SUPPLIES) IMPLANT
FIBER LASER FLEXIVA 550 (UROLOGICAL SUPPLIES) IMPLANT
FIBER LASER TRAC TIP (UROLOGICAL SUPPLIES) IMPLANT
GLOVE SURG SS PI 8.0 STRL IVOR (GLOVE) IMPLANT
GOWN STRL REUS W/TWL XL LVL3 (GOWN DISPOSABLE) ×3 IMPLANT
GUIDEWIRE STR DUAL SENSOR (WIRE) ×3 IMPLANT
IV NS 1000ML (IV SOLUTION) ×2
IV NS 1000ML BAXH (IV SOLUTION) ×1 IMPLANT
IV NS IRRIG 3000ML ARTHROMATIC (IV SOLUTION) ×3 IMPLANT
KIT TURNOVER KIT A (KITS) IMPLANT
MANIFOLD NEPTUNE II (INSTRUMENTS) ×3 IMPLANT
PACK CYSTO (CUSTOM PROCEDURE TRAY) ×3 IMPLANT
SHEATH URETERAL 12FRX35CM (MISCELLANEOUS) ×3 IMPLANT
TUBING CONNECTING 10 (TUBING) ×2 IMPLANT
TUBING CONNECTING 10' (TUBING) ×1
TUBING UROLOGY SET (TUBING) ×3 IMPLANT

## 2019-02-20 NOTE — Anesthesia Preprocedure Evaluation (Addendum)
Anesthesia Evaluation  Patient identified by MRN, date of birth, ID band Patient awake    Reviewed: Allergy & Precautions, NPO status , Patient's Chart, lab work & pertinent test results  Airway Mallampati: III  TM Distance: >3 FB Neck ROM: Full    Dental no notable dental hx. (+) Teeth Intact, Dental Advisory Given   Pulmonary neg pulmonary ROS, former smoker,    Pulmonary exam normal breath sounds clear to auscultation       Cardiovascular negative cardio ROS Normal cardiovascular exam Rhythm:Regular Rate:Normal     Neuro/Psych PSYCHIATRIC DISORDERS Anxiety Depression negative neurological ROS     GI/Hepatic negative GI ROS, (+)     substance abuse  marijuana use,   Endo/Other  negative endocrine ROS  Renal/GU negative Renal ROS  negative genitourinary   Musculoskeletal  (+) Arthritis ,   Abdominal   Peds  Hematology negative hematology ROS (+)   Anesthesia Other Findings Right UVJ stone  Reproductive/Obstetrics                            Anesthesia Physical Anesthesia Plan  ASA: II  Anesthesia Plan: General   Post-op Pain Management:    Induction: Intravenous  PONV Risk Score and Plan: 3 and Midazolam, Dexamethasone and Ondansetron  Airway Management Planned: LMA  Additional Equipment:   Intra-op Plan:   Post-operative Plan: Extubation in OR  Informed Consent: I have reviewed the patients History and Physical, chart, labs and discussed the procedure including the risks, benefits and alternatives for the proposed anesthesia with the patient or authorized representative who has indicated his/her understanding and acceptance.     Dental advisory given  Plan Discussed with: CRNA  Anesthesia Plan Comments:        Anesthesia Quick Evaluation

## 2019-02-20 NOTE — Discharge Instructions (Addendum)
CYSTOSCOPY HOME CARE INSTRUCTIONS  Activity: Rest for the remainder of the day.  Do not drive or operate equipment today.  You may resume normal activities in one to two days as instructed by your physician.   Meals: Drink plenty of liquids and eat light foods such as gelatin or soup this evening.  You may return to a normal meal plan tomorrow.  Return to Work: You may return to work in one to two days or as instructed by your physician.  Special Instructions / Symptoms: Call your physician if any of these symptoms occur:   -persistent or heavy bleeding  -bleeding which continues after first few urination  -large blood clots that are difficult to pass  -urine stream diminishes or stops completely  -fever equal to or higher than 101 degrees Farenheit.  -cloudy urine with a strong, foul odor  -severe pain  Females should always wipe from front to back after elimination.  You may feel some burning pain when you urinate.  This should disappear with time.  Applying moist heat to the lower abdomen or a hot tub bath may help relieve the pain. \  After you complete the Cipro, please begin the trimethoprim at bedtime to prevent further infections.    Patient Signature:  ________________________________________________________  Nurse's Signature:  ________________________________________________________

## 2019-02-20 NOTE — Op Note (Signed)
Procedure: 1.  Cystoscopy with right retrograde pyelogram and interpretation. 2.  Right diagnostic ureteroscopy.  Preop diagnosis: History of right distal ureteral stone with UTI.  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  Surgeon: Dr. Irine Seal.  Anesthesia: General.  Specimen: None.  Drain: None.  Complications: None.  EBL: None.  Indications: The patient is a 33 year old female who presented to the office this morning with significant nausea vomiting and lower abdominal discomfort.  She had been seen in the emergency room on 02/13/2019 and was found to have a small right distal ureteral stone.  There are also small renal stones.  She was felt to have a UTI in the ER and was put on antibiotics with p.o. Cipro and IV Cipro but a culture was not done.  A KUB in the office this morning demonstrated calcification in the pelvis that was felt to possibly be a phlebolith but the small stone cannot be ruled out.  The renal ultrasound showed minimal hydronephrosis.  I discussed continued observation versus cystoscopy with retrograde and possible ureteroscopy with the understanding the stone may have passed.  Because of her symptoms she wanted to proceed.  Procedure: She was taken operating room where she was given Cipro.  A general anesthetic was induced.  She was placed in lithotomy position and fitted with PAS hose.  Her perineum and genitalia were prepped with Betadine solution she was draped in usual sterile fashion.  Cystoscopy was performed using a 23 [place] scope and 30 degree lens.  Examination revealed a normal urethra.  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  Ureteral orifices were unremarkable.  A right retrograde pyelogram was then performed using a 5 [place] open-ended catheter and  Omnipaque.  The right retrograde pyelogram revealed a normal ureter and intrarenal collecting system without filling defects.  The system properly drained.  A gentle pass with a 4.5 French semirigid ureteroscope was made without difficulty approximately 5 cm up the right distal ureter to ensure that the stone was no longer present and it appears to have passed.  It was not felt that stenting was indicated.  She was taken down from lithotomy position, her anesthetic was reversed and she was moved to recovery in stable condition.  There were no complications.

## 2019-02-20 NOTE — Anesthesia Procedure Notes (Signed)
Procedure Name: LMA Insertion Date/Time: 02/20/2019 8:22 PM Performed by: Montel Clock, CRNA Pre-anesthesia Checklist: Patient identified, Emergency Drugs available, Suction available, Patient being monitored and Timeout performed Patient Re-evaluated:Patient Re-evaluated prior to induction Oxygen Delivery Method: Circle system utilized Preoxygenation: Pre-oxygenation with 100% oxygen Induction Type: IV induction LMA: LMA with gastric port inserted LMA Size: 4.0 Number of attempts: 1 Dental Injury: Teeth and Oropharynx as per pre-operative assessment

## 2019-02-20 NOTE — H&P (Signed)
CC: I have ureteral stone.  HPI: Shannon [name] is a 33 year-old female patient who is here for ureteral stone.  The problem is on the right side.   Shannon [name] is a 33 yo WF who was seen in the ER on 10/29 for the onset that day of severe right flank pain with nausea and vomiting. She was found to have a 2-78mm right UVJ stone with obstruction and forniceal rupture along with small bilateral stones. Her UA looked infected but no culture was done. She currently has nausea with vomiting. She has nocturia with night sweats. She mild pain. She has no fever but she was up to 99.1. She has some frequency but that is chronic. She had a culture in 1/20 that grew e. coli. She is on antibiotics.      ALLERGIES: penicillin    MEDICATIONS: Cipro  Ibuprofen  Promethazine Hcl     GU PSH: None   NON-GU PSH: Bilateral Tubal Ligation     GU PMH: None   NON-GU PMH: Anxiety Depression    FAMILY HISTORY: Kidney Stones - Runs in Family   SOCIAL HISTORY: Marital Status: Married Preferred Language: English; Race: White Current Smoking Status: Patient has never smoked.   Tobacco Use Assessment Completed: Used Tobacco in last 30 days? Drinks 4+ caffeinated drinks per day.     Notes: 2 daughters    REVIEW OF SYSTEMS:    GU Review Female:   Patient reports leakage of urine, get up at night to urinate, and frequent urination. Patient denies hard to postpone urination, burning /pain with urination, have to strain to urinate, being pregnant, trouble starting your stream, and stream starts and stops.  Gastrointestinal (Upper):   Patient reports nausea, vomiting, and indigestion/ heartburn.   Gastrointestinal (Lower):   Patient reports diarrhea. Patient denies constipation.  Constitutional:   Patient reports night sweats, weight loss, and fatigue. Patient denies fever.  Skin:   Patient denies skin rash/ lesion and itching.  Eyes:   Patient denies blurred vision and double vision.  Ears/ Nose/ Throat:    Patient denies sore throat and sinus problems.  Hematologic/Lymphatic:   Patient denies swollen glands and easy bruising.  Cardiovascular:   Patient denies leg swelling and chest pains.  Respiratory:   Patient reports cough. Patient denies shortness of breath.  Endocrine:   Patient denies excessive thirst.  Musculoskeletal:   Patient denies back pain and joint pain.  Neurological:   Patient reports headaches and dizziness.   Psychologic:   Patient reports depression and anxiety.    VITAL SIGNS:      02/20/2019 01:40 PM  Weight 171 lb / 77.56 kg  Height 70 in / 177.8 cm  BP 127/82 mmHg  Pulse 108 /min  Temperature 98.2 F / 36.7 C  BMI 24.5 kg/m   MULTI-SYSTEM PHYSICAL EXAMINATION:    Constitutional: Well-nourished. No physical deformities. Normally developed. Good grooming. ill appearing  Neck: Neck symmetrical, not swollen. Normal tracheal position.  Respiratory: Normal breath sounds. No labored breathing, no use of accessory muscles.   Cardiovascular: tachycardia with regular rhythm.   Lymphatic: No enlargement of neck, axillae, groin.  Skin: No paleness, no jaundice, no cyanosis. No lesion, no ulcer, no rash.  Neurologic / Psychiatric: Oriented to time, oriented to place, oriented to person. No depression, no anxiety, no agitation.  Gastrointestinal: Abdominal tenderness in RLQ. No mass, no rigidity, non obese abdomen.   Musculoskeletal: Normal gait and station of head and neck.     PAST  DATA REVIEWED:  Source Of History:  Patient  Lab Test Review:   CBC with Diff, CMP  Records Review:   Previous Hospital Records  Urine Test Review:   Urinalysis  X-Ray Review: KUB: Reviewed Films. Discussed With Patient.  Renal Ultrasound (Limited): Reviewed Films. Discussed With Patient. minimal hydro. C.T. Abdomen/Pelvis: Reviewed Films. Reviewed Report. Discussed With Patient.    Notes:                     ER records reviewed.    PROCEDURES:         Renal Ultrasound (Limited) - 03500   Kidney: Length: cm Depth: cm Cortical Width: cm Width: cm    Left Kidney/Ureter:  Normal left kidney. Normal left ureter.  Right Kidney/Ureter:  Normal right kidney. Normal right ureter.  Bladder:  Normal bladder.                KUB - [id number]  A single view of the abdomen is obtained. There is a 40mm RUP stone. There is a 2-26mm calcification in the right pelvis that could be a stone or a phlebolith. she has tubal clips in the pelvis. No bone, gas or soft tissue abnormalities are noted.                Urinalysis w/Scope Dipstick Dipstick Cont'd Micro  Color: Yellow Bilirubin: Neg mg/dL WBC/hpf: 6 - 10/hpf  Appearance: Clear Ketones: Neg mg/dL RBC/hpf: 3 - 10/hpf  Specific Gravity: 1.025 Blood: Trace ery/uL Bacteria: Rare (0-9/hpf)  pH: 6.0 Protein: 1+ mg/dL Cystals: NS (Not Seen)  Glucose: Neg mg/dL Urobilinogen: 0.2 mg/dL Casts: Granular    Nitrites: Neg Trichomonas: Not Present    Leukocyte Esterase: Trace leu/uL Mucous: Present      Epithelial Cells: 0 - 5/hpf      Yeast: NS (Not Seen)      Sperm: Not Present    ASSESSMENT:      ICD-10 Details  1 GU:   Renal and ureteral calculus - N20.2 She has a small right distal stone that has probably not passed based on her symptoms, UA and imaging. I discussed the options of observation vs URS and she would like to get it treated. I will schedule her for this evening. I have reviewed the risks of ureteroscopy including bleeding, infection, ureteral injury, need for a stent or secondary procedures, thrombotic events and anesthetic complications. Urine culture today.    PLAN:           Orders Labs Urine Culture  X-Rays: KUB    Renal Ultrasound (Limited) - right          Schedule Return Visit/Planned Activity: ASAP - Schedule Surgery          Document Letter(s):  Created for Patient: Clinical Summary         Next Appointment:      Next Appointment: 03/08/2019 02:00 PM    Appointment Type: Postoperative Appointment     Location: [facility] [phone] [id number]    Provider: Jiles Crocker, NP    Reason for Visit: POST OP

## 2019-02-20 NOTE — Transfer of Care (Signed)
Immediate Anesthesia Transfer of Care Note  Patient: Shannon [name]  Procedure(s) Performed: CYSTOSCOPY; RIGHT RETROGRADE PYELOGRAM RIGHT URETEROSCOPY (Right Ureter)  Patient Location: PACU  Anesthesia Type:General  Level of Consciousness: drowsy and patient cooperative  Airway & Oxygen Therapy: Patient Spontanous Breathing and Patient connected to face mask oxygen  Post-op Assessment: Report [name] to RN and Post -op Vital signs reviewed and stable  Post vital signs: Reviewed and stable  Last Vitals:  Vitals Value Taken Time  BP 113/78 02/20/19 2047  Temp    Pulse 94 02/20/19 2048  Resp 19 02/20/19 2048  SpO2 100 % 02/20/19 2048  Vitals shown include unvalidated device data.  Last Pain:  Vitals:   02/20/19 1733  TempSrc: Oral  PainSc: 2       Patients Stated Pain Goal: 2 (87/68/11 5726)  Complications: No apparent anesthesia complications

## 2019-02-21 ENCOUNTER — Encounter (HOSPITAL_COMMUNITY): Payer: Self-pay | Admitting: Urology

## 2019-02-21 NOTE — Anesthesia Postprocedure Evaluation (Signed)
Anesthesia Post Note  Patient: Shannon [name]  Procedure(s) Performed: CYSTOSCOPY; RIGHT RETROGRADE PYELOGRAM RIGHT URETEROSCOPY (Right Ureter)     Patient location during evaluation: PACU Anesthesia Type: General Level of consciousness: awake and alert Pain management: pain level controlled Vital Signs Assessment: post-procedure vital signs reviewed and stable Respiratory status: spontaneous breathing, nonlabored ventilation, respiratory function stable and patient connected to nasal cannula oxygen Cardiovascular status: blood pressure returned to baseline and stable Postop Assessment: no apparent nausea or vomiting Anesthetic complications: no    Last Vitals:  Vitals:   02/20/19 2115 02/20/19 2130  BP: (!) 100/58 119/80  Pulse: 89 90  Resp: 14 (!) 21  Temp:  36.7 C  SpO2: 98% 100%    Last Pain:  Vitals:   02/20/19 2130  TempSrc:   PainSc: 0-No pain                 Tiajuana Amass

## 2019-02-26 ENCOUNTER — Other Ambulatory Visit: Payer: Self-pay

## 2019-02-26 ENCOUNTER — Encounter (HOSPITAL_COMMUNITY): Payer: Self-pay | Admitting: Emergency Medicine

## 2019-02-26 ENCOUNTER — Emergency Department (HOSPITAL_COMMUNITY): Payer: No Typology Code available for payment source

## 2019-02-26 ENCOUNTER — Inpatient Hospital Stay (HOSPITAL_COMMUNITY)
Admission: EM | Admit: 2019-02-26 | Discharge: 2019-03-01 | DRG: 690 | Disposition: A | Discharge status: Home / Self Care | Payer: No Typology Code available for payment source | Attending: Internal Medicine | Admitting: Internal Medicine

## 2019-02-26 DIAGNOSIS — N10 Acute tubulo-interstitial nephritis: Secondary | ICD-10-CM | POA: Diagnosis present

## 2019-02-26 DIAGNOSIS — Z8249 Family history of ischemic heart disease and other diseases of the circulatory system: Secondary | ICD-10-CM

## 2019-02-26 DIAGNOSIS — Z20828 Contact with and (suspected) exposure to other viral communicable diseases: Secondary | ICD-10-CM | POA: Diagnosis present

## 2019-02-26 DIAGNOSIS — Z87442 Personal history of urinary calculi: Secondary | ICD-10-CM

## 2019-02-26 DIAGNOSIS — R10A1 Flank pain, right side: Secondary | ICD-10-CM | POA: Diagnosis present

## 2019-02-26 DIAGNOSIS — R112 Nausea with vomiting, unspecified: Secondary | ICD-10-CM | POA: Diagnosis present

## 2019-02-26 DIAGNOSIS — N200 Calculus of kidney: Secondary | ICD-10-CM | POA: Diagnosis present

## 2019-02-26 DIAGNOSIS — E876 Hypokalemia: Secondary | ICD-10-CM | POA: Diagnosis present

## 2019-02-26 DIAGNOSIS — Z880 Allergy status to penicillin: Secondary | ICD-10-CM | POA: Diagnosis not present

## 2019-02-26 DIAGNOSIS — N12 Tubulo-interstitial nephritis, not specified as acute or chronic: Secondary | ICD-10-CM | POA: Diagnosis present

## 2019-02-26 DIAGNOSIS — Z87891 Personal history of nicotine dependence: Secondary | ICD-10-CM

## 2019-02-26 DIAGNOSIS — R109 Unspecified abdominal pain: Secondary | ICD-10-CM | POA: Diagnosis not present

## 2019-02-26 LAB — URINALYSIS, ROUTINE W REFLEX MICROSCOPIC
Bilirubin Urine: NEGATIVE
Glucose, UA: NEGATIVE mg/dL
Hgb urine dipstick: NEGATIVE
Ketones, ur: NEGATIVE mg/dL
Nitrite: NEGATIVE
Protein, ur: NEGATIVE mg/dL
Specific Gravity, Urine: 1.011 (ref 1.005–1.030)
pH: 6 (ref 5.0–8.0)

## 2019-02-26 LAB — COMPREHENSIVE METABOLIC PANEL
ALT: 51 U/L — ABNORMAL HIGH (ref 0–44)
AST: 36 U/L (ref 15–41)
Albumin: 3.7 g/dL (ref 3.5–5.0)
Alkaline Phosphatase: 68 U/L (ref 38–126)
Anion gap: 8 (ref 5–15)
BUN: 9 mg/dL (ref 6–20)
CO2: 26 mmol/L (ref 22–32)
Calcium: 9.4 mg/dL (ref 8.9–10.3)
Chloride: 105 mmol/L (ref 98–111)
Creatinine, Ser: 0.66 mg/dL (ref 0.44–1.00)
GFR calc Af Amer: >60 mL/min (ref >60)
GFR calc non Af Amer: >60 mL/min (ref >60)
Glucose, Bld: 107 mg/dL — ABNORMAL HIGH (ref 70–99)
Potassium: 3.3 mmol/L — ABNORMAL LOW (ref 3.5–5.1)
Sodium: 139 mmol/L (ref 135–145)
Total Bilirubin: 0.3 mg/dL (ref 0.3–1.2)
Total Protein: 7.6 g/dL (ref 6.5–8.1)

## 2019-02-26 LAB — LIPASE, BLOOD: Lipase: 51 U/L (ref 11–51)

## 2019-02-26 LAB — CBC
HCT: 37.7 % (ref 36.0–46.0)
Hemoglobin: 11.9 g/dL — ABNORMAL LOW (ref 12.0–15.0)
MCH: 30.7 pg (ref 26.0–34.0)
MCHC: 31.6 g/dL (ref 30.0–36.0)
MCV: 97.2 fL (ref 80.0–100.0)
Platelets: 618 10*3/uL — ABNORMAL HIGH (ref 150–400)
RBC: 3.88 MIL/uL (ref 3.87–5.11)
RDW: 13 % (ref 11.5–15.5)
WBC: 12.8 10*3/uL — ABNORMAL HIGH (ref 4.0–10.5)
nRBC: 0 % (ref 0.0–0.2)

## 2019-02-26 LAB — I-STAT BETA HCG BLOOD, ED (MC, WL, AP ONLY): I-stat hCG, quantitative: <5 m[IU]/mL (ref <5)

## 2019-02-26 MED ORDER — SODIUM CHLORIDE 0.9 % IV SOLN
1.0000 g | Freq: Once | INTRAVENOUS | Status: AC
  Administered 2019-02-26: 23:00:00 1 g via INTRAVENOUS
  Filled 2019-02-26: qty 1

## 2019-02-26 MED ORDER — SODIUM CHLORIDE 0.9 % IV SOLN
1.0000 g | Freq: Three times a day (TID) | INTRAVENOUS | Status: DC
  Administered 2019-02-27 – 2019-03-01 (×7): 1 g via INTRAVENOUS
  Filled 2019-02-26 (×8): qty 1

## 2019-02-26 MED ORDER — LACTATED RINGERS IV SOLN
INTRAVENOUS | Status: DC
  Administered 2019-02-27 – 2019-02-28 (×4): via INTRAVENOUS

## 2019-02-26 MED ORDER — SUCRALFATE 1 GM/10ML PO SUSP
1.0000 g | Freq: Once | ORAL | Status: AC
  Administered 2019-02-26: 1 g via ORAL
  Filled 2019-02-26: qty 10

## 2019-02-26 MED ORDER — KETOROLAC TROMETHAMINE 15 MG/ML IJ SOLN
15.0000 mg | Freq: Four times a day (QID) | INTRAMUSCULAR | Status: DC | PRN
  Administered 2019-02-27 – 2019-03-01 (×4): 15 mg via INTRAVENOUS
  Filled 2019-02-26 (×5): qty 1

## 2019-02-26 MED ORDER — ONDANSETRON HCL 4 MG/2ML IJ SOLN
4.0000 mg | Freq: Once | INTRAMUSCULAR | Status: AC
  Administered 2019-02-26: 4 mg via INTRAVENOUS
  Filled 2019-02-26: qty 2

## 2019-02-26 MED ORDER — SODIUM CHLORIDE 0.9% FLUSH
3.0000 mL | Freq: Two times a day (BID) | INTRAVENOUS | Status: DC
  Administered 2019-02-27 – 2019-02-28 (×3): 3 mL via INTRAVENOUS

## 2019-02-26 MED ORDER — ACETAMINOPHEN 325 MG PO TABS
650.0000 mg | ORAL_TABLET | Freq: Four times a day (QID) | ORAL | Status: DC | PRN
  Administered 2019-02-27 – 2019-02-28 (×2): 650 mg via ORAL
  Filled 2019-02-26 (×2): qty 2

## 2019-02-26 MED ORDER — ACETAMINOPHEN 650 MG RE SUPP: 650.0000 mg | Freq: Four times a day (QID) | RECTAL | Status: DC | PRN

## 2019-02-26 MED ORDER — ONDANSETRON HCL 4 MG/2ML IJ SOLN
4.0000 mg | Freq: Three times a day (TID) | INTRAMUSCULAR | Status: DC | PRN
  Administered 2019-02-28 (×3): 4 mg via INTRAVENOUS
  Filled 2019-02-26 (×3): qty 2

## 2019-02-26 MED ORDER — POTASSIUM CHLORIDE 10 MEQ/100ML IV SOLN
10.0000 meq | INTRAVENOUS | Status: AC
  Administered 2019-02-27 (×2): 10 meq via INTRAVENOUS
  Filled 2019-02-26 (×3): qty 100

## 2019-02-26 MED ORDER — IOHEXOL 300 MG/ML  SOLN
100.0000 mL | Freq: Once | INTRAMUSCULAR | Status: AC | PRN
  Administered 2019-02-26: 21:00:00 100 mL via INTRAVENOUS

## 2019-02-26 MED ORDER — FAMOTIDINE IN NACL 20-0.9 MG/50ML-% IV SOLN
20.0000 mg | Freq: Once | INTRAVENOUS | Status: AC
  Administered 2019-02-26: 20 mg via INTRAVENOUS
  Filled 2019-02-26: qty 50

## 2019-02-26 MED ORDER — FENTANYL CITRATE (PF) 100 MCG/2ML IJ SOLN
25.0000 ug | Freq: Once | INTRAMUSCULAR | Status: AC
  Administered 2019-02-26: 25 ug via INTRAVENOUS
  Filled 2019-02-26: qty 2

## 2019-02-26 MED ORDER — SODIUM CHLORIDE 0.9 % IV BOLUS
1000.0000 mL | Freq: Once | INTRAVENOUS | Status: AC
  Administered 2019-02-26: 19:00:00 1000 mL via INTRAVENOUS

## 2019-02-26 NOTE — ED Provider Notes (Signed)
[facility]-EMERGENCY DEPT Provider Note   CSN: [id number] Arrival date & time: 02/26/19  1724     History   Chief Complaint Chief Complaint  Patient presents with   Abdominal Pain    HPI Shannon [name] is a 33 y.o. female.     HPI This adult female presents with concern of abdominal pain, nausea, vomiting, generalized discomfort.  Patient states that she is generally well, but beginning about 1 month ago she has had sickness. After flulike illness, the patient developed right-sided pain. She was found to have right-sided nephrolithiasis, and on 4 November had procedure with urology, that did not involve stenting. She reports that she was informed that her kidney stone likely passed, without other intervention. However, since about that time she has had worsening pain in the epigastrium and right upper quadrant.  On there is been persistent nausea, anorexia, and until yesterday she was having multiple episodes of vomiting daily. Diarrhea was also present until 2 days ago. She is unaware of fever, denies chest pain, denies dyspnea. No relief with OTC medication. 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   Sprain of calcaneofibular ligament of right ankle 05/30/2016   Closed nondisplaced fracture of fifth right metatarsal bone 05/30/2016   Depression, major, recurrent, moderate (HCC) 05/06/2014   Anxiety state,  unspecified 07/18/2012   Neck pain 07/18/2012   Jaw pain 07/18/2012    Past Surgical History:  Procedure Laterality Date   CYSTOSCOPY/URETEROSCOPY/HOLMIUM LASER/STENT PLACEMENT Right 02/20/2019   Procedure: CYSTOSCOPY; RIGHT RETROGRADE PYELOGRAM RIGHT URETEROSCOPY;  Surgeon: Bjorn Pippin, MD;  Location: [facility];  Service: Urology;  Laterality: Right;   SHOULDER ARTHROSCOPY WITH DISTAL CLAVICLE RESECTION Right 10/24/2013   Procedure: RIGHT SHOULDER DEBRIDEMENT EXTENSIVE, DISTAL CLAVICULECTOMY, DECOMPRESSION SUBACROMIAL PARTIAL ACROMIOPLASTY WITH DEBRIDEMENT OF LABRIUM;  Surgeon: Loreta Ave, MD;  Location: [facility];  Service: Orthopedics;  Laterality: Right;   TONSILLECTOMY     TUBAL LIGATION  10/19/2011   Procedure: POST PARTUM TUBAL LIGATION;  Surgeon: Dorien Chihuahua. Richardson Dopp, MD;  Location: [facility];  Service: Gynecology;  Laterality: Bilateral;   WISDOM TOOTH EXTRACTION       OB History    Gravida  3   Para  2   Term  2   Preterm      AB  1   Living  2     SAB  1   TAB  0   Ectopic  0   Multiple  0   Live Births  2            Home Medications    Prior to Admission medications   Medication Sig Start Date End Date Taking? Authorizing Provider  HYDROcodone-acetaminophen (NORCO/VICODIN) 5-325 MG tablet Take 1 tablet by mouth every 6 (six) hours as needed for moderate pain. 02/14/19  Yes Lawyer, Cristal Deer, PA-C  ibuprofen (ADVIL) 800 MG tablet Take 1 tablet (800  mg total) by mouth every 8 (eight) hours as needed. 02/14/19  Yes Lawyer, Cristal Deer, PA-C  promethazine (PHENERGAN) 25 MG tablet Take 1 tablet (25 mg total) by mouth every 8 (eight) hours as needed for nausea or vomiting. 02/14/19  Yes Lawyer, Cristal Deer, PA-C  ciprofloxacin (CIPRO) 500 MG tablet Take 1 tablet (500 mg total) by mouth 2 (two) times daily. 02/14/19   Lawyer, Cristal Deer, PA-C  trimethoprim (TRIMPEX) 100 MG tablet Take 1 tablet (100 mg total) by mouth daily. Please begin this  medication for infection prevention after completing the cipro. 02/20/19   Bjorn Pippin, MD    Family History Family History  Problem Relation Age of Onset   Bipolar disorder Mother    Heart disease Maternal Grandfather    Hypertension Maternal Grandfather    Cancer Maternal Grandfather    Depression Sister    Anxiety disorder Father    Anxiety disorder Sister    Anxiety disorder Brother     Social History Social History   Tobacco Use   Smoking status: Former Smoker    Quit date: 10/13/2009    Years since quitting: 9.3   Smokeless tobacco: Never Used  Substance Use Topics   Alcohol use: No    Alcohol/week: 0.0 standard drinks   Drug use: Yes    Types: Marijuana     Allergies   Penicillins and Codeine   Review of Systems Review of Systems  Constitutional:       Per HPI, otherwise negative  HENT:       Per HPI, otherwise negative  Respiratory:       Per HPI, otherwise negative  Cardiovascular:       Per HPI, otherwise negative  Gastrointestinal: Positive for abdominal pain, nausea and vomiting.  Endocrine:       Negative aside from HPI  Genitourinary:       Neg aside from HPI   Musculoskeletal:       Per HPI, otherwise negative  Skin: Negative.   Neurological: Negative for syncope.     Physical Exam Updated Vital Signs BP 105/65    Pulse 78    Temp 98.2 F (36.8 C) (Oral)    Resp 16    SpO2 100%   Physical Exam Vitals signs and nursing note reviewed.  Constitutional:      General: She is not in acute distress.    Appearance: She is well-developed.  HENT:     Head: Normocephalic and atraumatic.  Eyes:     Conjunctiva/sclera: Conjunctivae normal.  Cardiovascular:     Rate and Rhythm: Normal rate and regular rhythm.  Pulmonary:     Effort: Pulmonary effort is normal. No respiratory distress.     Breath sounds: Normal breath sounds. No stridor.  Abdominal:     General: There is no distension.     Tenderness: There is abdominal  tenderness. There is guarding. Positive signs include McBurney's sign.  Skin:    General: Skin is warm and dry.  Neurological:     Mental Status: She is alert and oriented to person, place, and time.     Cranial Nerves: No cranial nerve deficit.      ED Treatments / Results  Labs (all labs ordered are listed, but only abnormal results are displayed) Labs Reviewed  COMPREHENSIVE METABOLIC PANEL - Abnormal; Notable for the following components:      Result Value   Potassium 3.3 (*)    Glucose, Bld 107 (*)    ALT 51 (*)  All other components within normal limits  CBC - Abnormal; Notable for the following components:   WBC 12.8 (*)    Hemoglobin 11.9 (*)    Platelets 618 (*)    All other components within normal limits  URINALYSIS, ROUTINE W REFLEX MICROSCOPIC - Abnormal; Notable for the following components:   Leukocytes,Ua TRACE (*)    Bacteria, UA RARE (*)    All other components within normal limits  URINE CULTURE  SARS CORONAVIRUS 2 (TAT 6-24 HRS)  LIPASE, BLOOD  I-STAT BETA HCG BLOOD, ED ([facility], [facility], [facility] ONLY)    EKG None  Radiology US Abdomen Complete  Result Date: 02/26/2019 CLINICAL DATA:  History of renal stone and recent cystoscopy EXAM: ABDOMEN ULTRASOUND COMPLETE COMPARISON:  02/14/2019 FINDINGS: Gallbladder: No gallstones or wall thickening visualized. No sonographic Murphy sign noted by sonographer. Common bile duct: Diameter: 2.5 mm. Liver: No focal lesion identified. Within normal limits in parenchymal echogenicity. Portal vein is patent on color Doppler imaging with normal direction of blood flow towards the liver. IVC: No abnormality visualized. Pancreas: Visualized portion unremarkable. Spleen: Size and appearance within normal limits. Right Kidney: Length: 11.6 cm. 4 mm nonobstructing stone is noted. No mass lesion is noted. Left Kidney: Length: 11.1 cm. 4 mm nonobstructing stone is noted similar to that seen on prior CT. No mass lesion is noted. Abdominal  aorta: No aneurysm visualized. Other findings: None. IMPRESSION: Bilateral nonobstructing renal calculi similar to that seen on prior CT examination. No hydronephrosis is noted. Electronically Signed   By: Inez Catalina M.D.   On: 02/26/2019 20:02   Ct Abdomen Pelvis W Contrast  Result Date: 02/26/2019 CLINICAL DATA:  Right upper quadrant pain status post treatment for nephrolithiasis on 10/29. EXAM: CT ABDOMEN AND PELVIS WITH CONTRAST TECHNIQUE: Multidetector CT imaging of the abdomen and pelvis was performed using the standard protocol following bolus administration of intravenous contrast. CONTRAST:  157mL OMNIPAQUE IOHEXOL 300 MG/ML  SOLN COMPARISON:  02/14/2019 FINDINGS: Lower chest: The lung bases are clear. The heart size is normal. Hepatobiliary: There is decreased hepatic attenuation suggestive of hepatic steatosis. Normal gallbladder.There is no biliary ductal dilation. Pancreas: There is mild fat stranding about the pancreatic head. There is no drainable peripancreatic fluid collection. Spleen: No splenic laceration or hematoma. Adrenals/Urinary Tract: --Adrenal glands: Normal --Right kidney/ureter: They are is a 3.6 by 1.8 by 3 cm hypoattenuating area in the anterior interpolar region of the right kidney. There is a trace subcapsular correction surrounding the upper pole best visualized on the sagittal images. There is a tiny 1 cm cortical hypodensity in the upper pole best visualized on the sagittal images. Multiple nonobstructing stones are noted. --Left kidney/ureter: There are multiple nonobstructing stones without evidence for hydronephrosis. --Urinary bladder: The bladder is decompressed which limits evaluation. Stomach/Bowel: --Stomach/Duodenum: No hiatal hernia or other gastric abnormality. Normal duodenal course and caliber. --Small bowel: No dilatation or inflammation. --Colon: There is scattered colonic diverticula without CT evidence for diverticulitis --Appendix: Normal.  Vascular/Lymphatic: Normal course and caliber of the major abdominal vessels. --No retroperitoneal lymphadenopathy. --No mesenteric lymphadenopathy. --No pelvic or inguinal lymphadenopathy. Reproductive: The patient is status post bilateral tubal ligation. The left tubal ligation clip appears to be malpositioned and located in the posterior pelvis. Other: No ascites or free air. The abdominal wall is normal. Musculoskeletal. No acute displaced fractures. IMPRESSION: 1. Interval resolution of the previously demonstrated right-sided hydronephrosis. 2. Hypoattenuating area in the anterior interpolar region of the right kidney raises concern for right-sided pyelonephritis. Small  cystic areas in the interpolar region and upper pole may represent small abscesses. 3. Mild fat stranding about the pancreatic head. Correlation with lipase is recommended to help exclude pancreatitis. 4. Hepatic steatosis 5. Malpositioned left tubal ligation clip. Electronically Signed   By: Katherine Mantlehristopher  Green M.D.   On: 02/26/2019 21:49    Procedures Procedures (including critical care time)  Medications Ordered in ED Medications  aztreonam (AZACTAM) 1 g in sodium chloride 0.9 % 100 mL IVPB (has no administration in time range)  sodium chloride 0.9 % bolus 1,000 mL (0 mLs Intravenous Stopped 02/26/19 2101)  ondansetron (ZOFRAN) injection 4 mg (4 mg Intravenous Given 02/26/19 1920)  fentaNYL (SUBLIMAZE) injection 25 mcg (25 mcg Intravenous Given 02/26/19 1922)  famotidine (PEPCID) IVPB 20 mg premix (0 mg Intravenous Stopped 02/26/19 2101)  sucralfate (CARAFATE) 1 GM/10ML suspension 1 g (1 g Oral Given 02/26/19 2029)  iohexol (OMNIPAQUE) 300 MG/ML solution 100 mL (100 mLs Intravenous Contrast Given 02/26/19 2112)     Initial Impression / Assessment and Plan / ED Course  I have reviewed the triage vital signs and the nursing notes.  Pertinent labs & imaging results that were available during my care of the patient were  reviewed by me and considered in my medical decision making (see chart for details).    After the initial evaluation I reviewed the patient's chart including documentation of the urology visit last week, initial ED visit prior to that. Op note as below:  Cystoscopy was performed using a 23 [place] scope and 30 degree lens.  Examination revealed a normal urethra.  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  Ureteral orifices were unremarkable.   A right retrograde pyelogram was then performed using a 5 [place] open-ended catheter and Omnipaque.   The right retrograde pyelogram revealed a normal ureter and intrarenal collecting system without filling defects.  The system properly drained.   A gentle pass with a 4.5 French semirigid ureteroscope was made without difficulty approximately 5 cm up the right distal ureter to ensure that the stone was no longer present and it appears to have passed.  It was not felt that stenting was indicated.      On repeat exam the patient continues to complain of pain. With reassuring ultrasound, low suspicion for acute cholecystitis. She has received fluids, analgesia, antiemetic.  With worsening pain, concern for post procedure complication versus infection, CT planned.  10:30 PM I reviewed the CT findings with the patient at length, with concern for pyelonephritis, possible small perinephric abscess, given her abnormal urinalysis, and worsening pain, she will be admitted, antibiotics changed from her Cipro, to aztreonam, as she has a penicillin allergy.   Final Clinical Impressions(s) / ED Diagnoses   Final diagnoses:  Pyelonephritis     Gerhard MunchLockwood, Danae Oland, MD 02/26/19 2232

## 2019-02-26 NOTE — ED Notes (Signed)
Patient transported to CT via stretcher.

## 2019-02-26 NOTE — ED Triage Notes (Signed)
Patient reports RUQ pain since treated for kidney stone on 10/29. States N/V/D a few days ago that has since resolved.

## 2019-02-26 NOTE — Progress Notes (Signed)
Pharmacy Antibiotic Note  Shannon [name] is a 33 y.o. female admitted on 02/26/2019 with UTI/ pyelonephritis.  Completed course of Cipro as outpatient yesterday. Pharmacy has been consulted for Aztreonam dosing. PCN causing hives noted.  Per medical record she has not received cephalosporins at this facility.   Afebrile, WBC mildly elevated at 12.8, Renal function at patient's baseline.  CT abdomen + right-sided pyelonephritis with possible abscess.   Plan: Aztreonam 1gm IV q8h Monitor renal function and cx data      Temp (24hrs), Avg:98.2 F (36.8 C), Min:98.2 F (36.8 C), Max:98.2 F (36.8 C)  Recent Labs  Lab 02/26/19 1748  WBC 12.8*  CREATININE 0.66    Estimated Creatinine Clearance: 107.2 mL/min (by C-G formula based on SCr of 0.66 mg/dL).    Allergies  Allergen Reactions  . Penicillins Hives    DID THE REACTION INVOLVE: Swelling of the face/tongue/throat, SOB, or low BP? No Sudden or severe rash/hives, skin peeling, or the inside of the mouth or nose? Yes Did it require medical treatment? No When did it last happen?Over 10 years ago If all above answers are "NO", may proceed with cephalosporin use.  . Codeine Itching    Antimicrobials this admission: 11/10 Aztreonam >>   Dose adjustments this admission:  Microbiology results: BCx: 11/10 UCx:   11/10 COVID: negative  Thank you for allowing pharmacy to be a part of this patient's care.  Netta Cedars, PharmD, BCPS 02/26/2019 11:45 PM

## 2019-02-26 NOTE — ED Notes (Signed)
Lab called to add on urine culture.  ?

## 2019-02-26 NOTE — H&P (Signed)
History and Physical    PLEASE NOTE THAT DRAGON DICTATION SOFTWARE WAS USED IN THE CONSTRUCTION OF THIS NOTE.   Shannon [name] SHF:[id number] DOB: 1985/10/08 DOA: 02/26/2019  PCP: Sue Lush, PA-C Patient coming from: Home  I have personally briefly reviewed patient's old medical records in [facility]  Chief Complaint: Right flank pain  HPI: Shannon [name] is a 33 y.o. female with medical history significant for recent right-sided ureterolithiasis with hydronephrosis, who is admitted to [facility] long hospital on 02/26/2019 with right pyelonephritis after resenting from home to [facility] long emergency department complaining of right flank discomfort.   Starting approximately 10 to 12 days ago, the patient reports that she developed right flank discomfort, prompting CT abdomen/pelvis on 02/14/2019, which showed evidence of mild right hydroureteronephrosis, with a 3 mm calculus noted in the urinary bladder along with bilateral, nonobstructing nephrolithiasis.  Subsequently, on 02/20/2019, the patient underwent cystoscopy with right ureteroscopy and right retrograde pyelogram via Dr. Jeffie Pollock of urology, which showed no evidence of filling defect as well as no evidence of right ureteral stone, prompting suspicion of a recently passed right ureterolithiasis.  The patient was instructed to complete a course of ciprofloxacin and was prescribed as needed Norco as well as as needed Phenergan for any residual nausea.  Of note, there was no urine culture from this previous work-up.   However following the after mentioned urologic procedure on 02/20/2019, the patient reports persistent right flank discomfort with radiation into the right upper abdominal quadrant.  She describes his pain as sharp in nature, with exacerbation via palpation over the right flank as well as over the right upper quadrant of the abdomen.  She also notes associated nausea resulting in 1-2 daily episodes of nonbloody,  nonbilious emesis since the time of her procedure on 02/20/2019.  She denies any associated dysuria or urinary urgency/frequency.  She also denies any associated subjective fever, chills, or full body rigors.  Denies any associated diarrhea.  No recent trauma.  She also denies any associated headache, neck stiffness, sore throat, cough, shortness of breath, or rash.  Denies any known positive COVID-19 contacts.  In the setting of ongoing right flank discomfort as well as associated nausea/vomiting in spite of compliance with outpatient ciprofloxacin and suboptimal symptomatic control with outpatient as needed Norco, as needed ibuprofen, as needed Phenergan, the patient presents to [facility] long emergency department for further evaluation.    ED Course:  Vital signs in the emergency department today were notable for the following: Temperature 98.2; initial heart rate 99, which improved to 77 following administration of IV fluids, as further described below; blood pressure ranged from 105/65-130 1/95; respiratory rate 15-16, and oxygen saturation 98 to 100% in room air.  Labs were notable for the following: CMP notable for sodium 139, potassium 3.3, bicarbonate 26, creatinine 0.66 relative to most recent prior creatinine 1.05 on 02/14/2019, BUN/creatinine ratio 13.6, alkaline phosphatase 68, AST 36, ALT 51, and total bilirubin 0.3.  Lipase 51.  CBC notable for white blood cell count of 12,800 relative to most recent prior white blood cell count of 33,000 on 02/14/2019, hemoglobin 11.9.  Urinalysis showed 6-10 white blood cells and trace leukocyte esterase, and urine sample was sent for culture.  CT abdomen/pelvis with contrast performed this evening showed multiple nonobstructing stones in the right kidney with evidence of right sided pyelonephritis as well as potential small perinephric abscess.  CT abdomen/pelvis also showed interval resolution of right hydronephrosis and demonstrated no evidence of  gallbladder  wall thickening pericholecystic fluid, or common bile duct dilation.  Additionally, abdominal ultrasound performed this evening showed bilateral nonobstructing renal calculi, with largest renal stone noted to measure 4 mm, bilaterally.  Abdominal ultrasound was also notable for no evidence of gallbladder wall thickening or common bile duct dilation.  While in the ED this evening, the following were administered: Fentanyl 25 mcg IV x1, Zofran 4 mg IV x1, and a 1 L IV normal saline bolus.  Additionally, in the setting of patient's report of good compliance and completing a 10-day course of ciprofloxacin, with final dose occurring on 02/25/2019, and in the context of a reported allergy to penicillin which the patient develops hives, she received a dose of IV aztreonam.  Subsequently, the patient was admitted to the med telemetry floor for further evaluation and management of suspected right pyelonephritis as well as symptomatic management for presenting flank pain and N/V.    Review of Systems: As per HPI otherwise 10 point review of systems negative.   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/2013   Disorders of bursae and tendons in shoulder region, unspecified 10/2013   right   History of kidney stones    Recent upper respiratory tract infection 10/22/2013   states still has some congestion    Past Surgical History:  Procedure Laterality Date   CYSTOSCOPY/URETEROSCOPY/HOLMIUM LASER/STENT PLACEMENT Right 02/20/2019   Procedure: CYSTOSCOPY; RIGHT RETROGRADE PYELOGRAM RIGHT URETEROSCOPY;  Surgeon: Irine Seal, MD;  Location: [facility];  Service: Urology;  Laterality: Right;   SHOULDER ARTHROSCOPY WITH DISTAL CLAVICLE RESECTION Right 10/24/2013   Procedure: RIGHT SHOULDER DEBRIDEMENT EXTENSIVE,  DISTAL CLAVICULECTOMY, DECOMPRESSION SUBACROMIAL PARTIAL ACROMIOPLASTY WITH DEBRIDEMENT OF LABRIUM;  Surgeon: Ninetta Lights, MD;  Location: [facility];  Service: Orthopedics;  Laterality: Right;   TONSILLECTOMY     TUBAL LIGATION  10/19/2011   Procedure: POST PARTUM TUBAL LIGATION;  Surgeon: [name]. Landry Mellow, MD;  Location: [facility];  Service: Gynecology;  Laterality: Bilateral;   WISDOM TOOTH EXTRACTION      Social History:  reports that she quit smoking about 9 years ago. She has never used smokeless tobacco. She reports current drug use. Drug: Marijuana. She reports that she does not drink alcohol.   Allergies  Allergen Reactions   Penicillins Hives    DID THE REACTION INVOLVE: Swelling of the face/tongue/throat, SOB, or low BP? No Sudden or severe rash/hives, skin peeling, or the inside of the mouth or nose? Yes Did it require medical treatment? No When did it last happen?Over 10 years ago If all above answers are NO, may proceed with cephalosporin use.   Codeine Itching    Family History  Problem Relation Age of Onset   Bipolar disorder Mother    Heart disease Maternal Grandfather    Hypertension Maternal Grandfather    Cancer Maternal Grandfather    Depression Sister    Anxiety disorder Father    Anxiety disorder Sister    Anxiety disorder Brother     Prior to Admission medications   Medication Sig Start Date End Date Taking? Authorizing Provider  HYDROcodone-acetaminophen (NORCO/VICODIN) 5-325 MG tablet Take 1 tablet by mouth every 6 (six) hours as needed for moderate pain. 02/14/19  Yes Lawyer, Harrell Gave, PA-C  ibuprofen (ADVIL) 800 MG tablet  Take 1 tablet (800 mg total) by mouth every 8 (eight) hours as needed. 02/14/19  Yes Lawyer, Harrell Gave, PA-C  promethazine (PHENERGAN) 25 MG tablet Take 1 tablet (25 mg total) by mouth every 8 (eight) hours as needed for nausea or vomiting. 02/14/19  Yes Lawyer, Harrell Gave, PA-C  ciprofloxacin (CIPRO)  500 MG tablet Take 1 tablet (500 mg total) by mouth 2 (two) times daily. 02/14/19   Lawyer, Harrell Gave, PA-C  trimethoprim (TRIMPEX) 100 MG tablet Take 1 tablet (100 mg total) by mouth daily. Please begin this medication for infection prevention after completing the cipro. 02/20/19   Irine Seal, MD     Objective    Physical Exam: Vitals:   02/26/19 1900 02/26/19 2010 02/26/19 2011 02/26/19 2240  BP:  105/65  121/80  Pulse: 99 78 78 77  Resp:  16  15  Temp:      TempSrc:      SpO2: 100% 100% 100% 98%    General: appears to be stated age; alert, oriented Skin: warm, dry, no rash Head:  AT/[place] Eyes:  PEARL b/l, EOMI Mouth:  Oral mucosa membranes appear dry, normal dentition Neck: supple; trachea midline Heart:  RRR; did not appreciate any M/R/G Lungs: CTAB, did not appreciate any wheezes, rales, or rhonchi Abdomen: + BS; soft, ND; tenderness to palpation over RUQ in the absence of associated guarding, rigidity, or rebound tenderness; negative Murphy sign.  Right CVA tenderness with positive Lloyd sign. Vascular: 2+ pedal pulses b/l; 2+ radial pulses b/l Extremities: no peripheral edema, no muscle wasting   Labs on Admission: I have personally reviewed following labs and imaging studies  CBC: Recent Labs  Lab 02/26/19 1748  WBC 12.8*  HGB 11.9*  HCT 37.7  MCV 97.2  PLT 299*   Basic Metabolic Panel: Recent Labs  Lab 02/26/19 1748  NA 139  K 3.3*  CL 105  CO2 26  GLUCOSE 107*  BUN 9  CREATININE 0.66  CALCIUM 9.4   GFR: Estimated Creatinine Clearance: 107.2 mL/min (by C-G formula based on SCr of 0.66 mg/dL). Liver Function Tests: Recent Labs  Lab 02/26/19 1748  AST 36  ALT 51*  ALKPHOS 68  BILITOT 0.3  PROT 7.6  ALBUMIN 3.7   Recent Labs  Lab 02/26/19 1748  LIPASE 51   No results for input(s): AMMONIA in the last 168 hours. Coagulation Profile: No results for input(s): INR, PROTIME in the last 168 hours. Cardiac Enzymes: No results for input(s):  CKTOTAL, CKMB, CKMBINDEX, TROPONINI in the last 168 hours. BNP (last 3 results) No results for input(s): PROBNP in the last 8760 hours. HbA1C: No results for input(s): HGBA1C in the last 72 hours. CBG: No results for input(s): GLUCAP in the last 168 hours. Lipid Profile: No results for input(s): CHOL, HDL, LDLCALC, TRIG, CHOLHDL, LDLDIRECT in the last 72 hours. Thyroid Function Tests: No results for input(s): TSH, T4TOTAL, FREET4, T3FREE, THYROIDAB in the last 72 hours. Anemia Panel: No results for input(s): VITAMINB12, FOLATE, FERRITIN, TIBC, IRON, RETICCTPCT in the last 72 hours. Urine analysis:    Component Value Date/Time   COLORURINE YELLOW 02/26/2019 1816   APPEARANCEUR CLEAR 02/26/2019 1816   LABSPEC 1.011 02/26/2019 1816   PHURINE 6.0 02/26/2019 1816   GLUCOSEU NEGATIVE 02/26/2019 1816   HGBUR NEGATIVE 02/26/2019 1816   BILIRUBINUR NEGATIVE 02/26/2019 1816   KETONESUR NEGATIVE 02/26/2019 1816   PROTEINUR NEGATIVE 02/26/2019 1816   UROBILINOGEN 0.2 08/15/2011 1440   NITRITE NEGATIVE 02/26/2019 1816   LEUKOCYTESUR TRACE (A)  02/26/2019 1816    Radiological Exams on Admission: US Abdomen Complete  Result Date: 02/26/2019 CLINICAL DATA:  History of renal stone and recent cystoscopy EXAM: ABDOMEN ULTRASOUND COMPLETE COMPARISON:  02/14/2019 FINDINGS: Gallbladder: No gallstones or wall thickening visualized. No sonographic Murphy sign noted by sonographer. Common bile duct: Diameter: 2.5 mm. Liver: No focal lesion identified. Within normal limits in parenchymal echogenicity. Portal vein is patent on color Doppler imaging with normal direction of blood flow towards the liver. IVC: No abnormality visualized. Pancreas: Visualized portion unremarkable. Spleen: Size and appearance within normal limits. Right Kidney: Length: 11.6 cm. 4 mm nonobstructing stone is noted. No mass lesion is noted. Left Kidney: Length: 11.1 cm. 4 mm nonobstructing stone is noted similar to that seen on prior  CT. No mass lesion is noted. Abdominal aorta: No aneurysm visualized. Other findings: None. IMPRESSION: Bilateral nonobstructing renal calculi similar to that seen on prior CT examination. No hydronephrosis is noted. Electronically Signed   By: Inez Catalina M.D.   On: 02/26/2019 20:02   Ct Abdomen Pelvis W Contrast  Result Date: 02/26/2019 CLINICAL DATA:  Right upper quadrant pain status post treatment for nephrolithiasis on 10/29. EXAM: CT ABDOMEN AND PELVIS WITH CONTRAST TECHNIQUE: Multidetector CT imaging of the abdomen and pelvis was performed using the standard protocol following bolus administration of intravenous contrast. CONTRAST:  144m OMNIPAQUE IOHEXOL 300 MG/ML  SOLN COMPARISON:  02/14/2019 FINDINGS: Lower chest: The lung bases are clear. The heart size is normal. Hepatobiliary: There is decreased hepatic attenuation suggestive of hepatic steatosis. Normal gallbladder.There is no biliary ductal dilation. Pancreas: There is mild fat stranding about the pancreatic head. There is no drainable peripancreatic fluid collection. Spleen: No splenic laceration or hematoma. Adrenals/Urinary Tract: --Adrenal glands: Normal --Right kidney/ureter: They are is a 3.6 by 1.8 by 3 cm hypoattenuating area in the anterior interpolar region of the right kidney. There is a trace subcapsular correction surrounding the upper pole best visualized on the sagittal images. There is a tiny 1 cm cortical hypodensity in the upper pole best visualized on the sagittal images. Multiple nonobstructing stones are noted. --Left kidney/ureter: There are multiple nonobstructing stones without evidence for hydronephrosis. --Urinary bladder: The bladder is decompressed which limits evaluation. Stomach/Bowel: --Stomach/Duodenum: No hiatal hernia or other gastric abnormality. Normal duodenal course and caliber. --Small bowel: No dilatation or inflammation. --Colon: There is scattered colonic diverticula without CT evidence for  diverticulitis --Appendix: Normal. Vascular/Lymphatic: Normal course and caliber of the major abdominal vessels. --No retroperitoneal lymphadenopathy. --No mesenteric lymphadenopathy. --No pelvic or inguinal lymphadenopathy. Reproductive: The patient is status post bilateral tubal ligation. The left tubal ligation clip appears to be malpositioned and located in the posterior pelvis. Other: No ascites or free air. The abdominal wall is normal. Musculoskeletal. No acute displaced fractures. IMPRESSION: 1. Interval resolution of the previously demonstrated right-sided hydronephrosis. 2. Hypoattenuating area in the anterior interpolar region of the right kidney raises concern for right-sided pyelonephritis. Small cystic areas in the interpolar region and upper pole may represent small abscesses. 3. Mild fat stranding about the pancreatic head. Correlation with lipase is recommended to help exclude pancreatitis. 4. Hepatic steatosis 5. Malpositioned left tubal ligation clip. Electronically Signed   By: CConstance HolsterM.D.   On: 02/26/2019 21:49     Assessment/Plan   KQuinlynn [name] a 33y.o. female with medical history significant for recent right-sided ureterolithiasis with hydronephrosis, [facility] with right pyelonephritis after [facility] complaining of right flank discomfort.    Principal Problem:   Pyelonephritis of right kidney Active Problems:   Right flank pain   Nausea & vomiting   Hypokalemia   #) Right pyelonephritis: Diagnosis on the basis of 1 week of progressive right flank discomfort associated with nausea/vomiting, with CT abdomen/pelvis performed this evening demonstrating evidence of right-sided pyelonephritis in the absence of any evidence of obstructing stone.  CT abdomen/pelvis also showed evidence of interval resolution of right hydronephrosis compared to CT imaging performed on  02/14/2019, and abdominal ultrasound performed this evening was also suggestive of nonobstructing renal calculi.  This is all in the context of cystoscopy/right ureteroscopy performed on 02/20/2019 by Dr. Jeffie Pollock, which demonstrated no evidence of filling defect or ureterolithiasis, and was felt to suggest a recently passed right ureterolithiasis.  Patient's presenting right flank discomfort as well as nausea/vomiting have progressed over the last week in spite of the patient's reported good compliance with 10-day course of ciprofloxacin, and she conveys inability to manage her symptoms as an outpatient with as needed Norco, as needed ibuprofen, and as needed Phenergan given intermittent nausea/vomiting.  While this evening's labs reflect mild leukocytosis, SIRS criteria are otherwise not met, and therefore criteria are not met for sepsis at this time.  Antibiotic selection is slightly complicated by patient's history of an allergy to the penicillin class, in which she develops hives in response to this medication.  In this context, and given the patient's report of good compliance and completing a 10-day course of ciprofloxacin as an outpatient, the patient was given a dose of IV aztreonam in the ED.   Plan: Check blood cultures x2.  Will monitor for result of urine culture collected this evening.  Continue IV aztreonam, as above.  Repeat CBC with differential in the morning.  As needed Toradol as well as as needed Zofran.  Lactated Ringer's at 100 cc/h.  Monitor strict I's and O's.  Repeat BMP in the morning. Consider discussing with possibility of small right perinephric abscess identified on this evening's CT abdomen/pelvis with urology vs radiology. NPO after 5 AM in case patient should require subsequent surgical procedure.     #) Hypokalemia: Presenting labs reflect serum potassium of 3.3, which appears to be in the context of diminished oral intake as well as increased GI losses in the form of multiple  episodes of daily nausea/vomiting over the last several days in the context of presenting right pyelonephritis.  Plan: Potassium chloride 40 mEq IV over 4 hours x 1 now.  Add on serum magnesium level to the labs collected in the ED.  Repeat BMP in the morning.  As needed IV Zofran, as above.     #) Right flank pain: 1 week of progressive right flank pain, which appears to be on the basis of right pyelonephritis, as further described above, and is consistent with positive right-sided Earnie Larsson sign identified on today's physical exam.  Of note, CT abdomen/pelvis as well as abdominal ultrasound, both performed in the ED this evening, showed no evidence of acute cholecystitis.   Plan: As needed IV Toradol.  Work-up and management of presenting right pyelonephritis, as further described above.     #) Nausea/vomiting: Approximately 1 week 1-2 daily episodes of nausea/vomiting associated with nonbloody, nonbilious emesis, which has been poorly controlled with as needed Phenergan on outpatient basis.  Appears to be on the basis of presenting right pyelonephritis, as further described above.  Patient appears slightly dehydrated on this basis,  and presenting labs have been notable for mild hypokalemia, as further described above.  She appears hemodynamically stable.  Plan: As needed IV Zofran.  Additional work-up and management of right pyelonephritis, as further described above.     DVT prophylaxis: SCDs Code Status: Full code Family Communication: None Disposition Plan: Per Rounding Team Consults called: None Admission status: Inpatient; med telemetry.    PLEASE NOTE THAT DRAGON DICTATION SOFTWARE WAS USED IN THE CONSTRUCTION OF THIS NOTE.   [place] [facility] Pager [phone] From [place].   Otherwise, please contact night-coverage  [email or web address] Password [email or web address]  02/26/2019, 10:58 PM

## 2019-02-26 NOTE — ED Notes (Signed)
Pt ambulatory from triage 

## 2019-02-27 DIAGNOSIS — N12 Tubulo-interstitial nephritis, not specified as acute or chronic: Secondary | ICD-10-CM

## 2019-02-27 LAB — CBC WITH DIFFERENTIAL/PLATELET
Abs Immature Granulocytes: 0.06 10*3/uL (ref 0.00–0.07)
Basophils Absolute: 0 10*3/uL (ref 0.0–0.1)
Basophils Relative: 0 %
Eosinophils Absolute: 0.2 10*3/uL (ref 0.0–0.5)
Eosinophils Relative: 2 %
HCT: 33.2 % — ABNORMAL LOW (ref 36.0–46.0)
Hemoglobin: 10.5 g/dL — ABNORMAL LOW (ref 12.0–15.0)
Immature Granulocytes: 1 %
Lymphocytes Relative: 29 %
Lymphs Abs: 3 10*3/uL (ref 0.7–4.0)
MCH: 30.6 pg (ref 26.0–34.0)
MCHC: 31.6 g/dL (ref 30.0–36.0)
MCV: 96.8 fL (ref 80.0–100.0)
Monocytes Absolute: 0.8 10*3/uL (ref 0.1–1.0)
Monocytes Relative: 8 %
Neutro Abs: 6.4 10*3/uL (ref 1.7–7.7)
Neutrophils Relative %: 60 %
Platelets: 468 10*3/uL — ABNORMAL HIGH (ref 150–400)
RBC: 3.43 MIL/uL — ABNORMAL LOW (ref 3.87–5.11)
RDW: 13 % (ref 11.5–15.5)
WBC: 10.5 10*3/uL (ref 4.0–10.5)
nRBC: 0 % (ref 0.0–0.2)

## 2019-02-27 LAB — COMPREHENSIVE METABOLIC PANEL
ALT: 41 U/L (ref 0–44)
AST: 29 U/L (ref 15–41)
Albumin: 3.2 g/dL — ABNORMAL LOW (ref 3.5–5.0)
Alkaline Phosphatase: 60 U/L (ref 38–126)
Anion gap: 6 (ref 5–15)
BUN: 8 mg/dL (ref 6–20)
CO2: 25 mmol/L (ref 22–32)
Calcium: 9.2 mg/dL (ref 8.9–10.3)
Chloride: 106 mmol/L (ref 98–111)
Creatinine, Ser: 0.74 mg/dL (ref 0.44–1.00)
GFR calc Af Amer: >60 mL/min (ref >60)
GFR calc non Af Amer: >60 mL/min (ref >60)
Glucose, Bld: 96 mg/dL (ref 70–99)
Potassium: 3.9 mmol/L (ref 3.5–5.1)
Sodium: 137 mmol/L (ref 135–145)
Total Bilirubin: 0.4 mg/dL (ref 0.3–1.2)
Total Protein: 6.6 g/dL (ref 6.5–8.1)

## 2019-02-27 LAB — SARS CORONAVIRUS 2 (TAT 6-24 HRS): SARS Coronavirus 2: NEGATIVE

## 2019-02-27 LAB — MAGNESIUM: Magnesium: 1.7 mg/dL (ref 1.7–2.4)

## 2019-02-27 NOTE — Progress Notes (Signed)
Patient received to room 1515 from ED, arrived via [facility]. Patient alert and oriented x4. No complaints. Admission assessment and history completed. Skin and tele verified with charge nurse, Raquel Sarna. Patient c/o upper mid abd pain 4/10 and tolerable at this time.

## 2019-02-27 NOTE — ED Notes (Signed)
Report given to Christy, RN

## 2019-02-27 NOTE — Progress Notes (Signed)
PROGRESS NOTE    Shannon [name]  TDD:[id number] DOB: 1986-03-01 DOA: 02/26/2019 PCP: Sue Lush, PA-C   Brief Narrative:  Shannon [name] is a 33 y.o. female with medical history significant for recent right-sided ureterolithiasis with hydronephrosis, she underwent cystoscopy with right ureteroscopy and right retrograde pyelogram with Dr. Jeffie Pollock of urology,which showed no evidence of filling defect as well as no evidence of right ureteral stone, prompting suspicion of a recently passed right ureterolithiasis.  The patient was instructed to complete a course of ciprofloxacin and was prescribed as needed Norco as well as as needed Phenergan for any residual nausea.  Patient reported persistent right flank discomfort after her procedure and developed some nausea and vomiting.  She was seen in ED yesterday because of worsening right flank pain and CT scan with right pyelonephritis.  She was admitted for IV antibiotics as she failed outpatient ciprofloxacin. She was started on aztreonam due to her penicillin allergies.  Subjective: Patient was feeling better when seen this morning.  Nausea and vomiting has been resolved and she was asking for food.  Pain is improving.   Assessment & Plan:   Principal Problem:   Pyelonephritis of right kidney Active Problems:   Right flank pain   Nausea & vomiting   Hypokalemia  Right pyelonephritis.  Repeat CT scanning yesterday shows multiple nonobstructed bilateral stones with right pyelonephritis and possible small abscess.  Patient recently has a urological procedure due to nephrolithiasis.  Failed 10 days course of ciprofloxacin as an outpatient.  Talked with Dr. Matilde Sprang from urology who advised to continue treating her pyelonephritis with IV antibiotics and follow-up with Dr. Jeffie Pollock as an outpatient according to her scheduled appointment on 03/08/19. Patient remained afebrile with improving leukocytosis. Urine culture results pending, previously  she was positive for pretty sensitive E. Coli in January 2020.  No urine culture during her recent course of nephrolithiasis. -Continue aztreonam for now-we will narrow her coverage depending on culture results. -Continue Zofran as needed. -Continue to monitor if deteriorates will repeat CT scan to see the status of those small abscesses.  Hypokalemia.  Resolved after replacement.  Magnesium was within normal limit at 1.7.  Objective: Vitals:   02/27/19 0235 02/27/19 0539 02/27/19 0558 02/27/19 1338  BP: 116/76  113/81 101/74  Pulse: 74  62 78  Resp: 14  16 18   Temp:   98.7 F (37.1 C) 98.5 F (36.9 C)  TempSrc:   Oral Oral  SpO2: 95%  100% 100%  Weight:  80.4 kg    Height:  5\' 10"  (1.778 m)      Intake/Output Summary (Last 24 hours) at 02/27/2019 1530 Last data filed at 02/27/2019 0900 Gross per 24 hour  Intake 2866.48 ml  Output -  Net 2866.48 ml   Filed Weights   02/27/19 0539  Weight: 80.4 kg    Examination:  General exam: Appears calm and comfortable  Respiratory system: Clear to auscultation. Respiratory effort normal. Cardiovascular system: S1 & S2 heard, RRR. No JVD, murmurs, rubs, gallops or clicks. No pedal edema. Gastrointestinal system: Abdomen is nondistended, soft and nontender. No organomegaly or masses felt. Normal bowel sounds heard. Central nervous system: Alert and oriented. No focal neurological deficits. Extremities: Symmetric 5 x 5 power. Skin: No rashes, lesions or ulcers Psychiatry: Judgement and insight appear normal. Mood & affect appropriate.    DVT prophylaxis: SCDs Code Status: Full Family Communication: Discussed with patient, no family at bedside. Disposition Plan: Pending improvement.  Consultants:  Procedures:  Antimicrobials:  Aztreonam-day 2  Data Reviewed: I have personally reviewed following labs and imaging studies  CBC: Recent Labs  Lab 02/26/19 1748 02/27/19 0556  WBC 12.8* 10.5  NEUTROABS  --  6.4  HGB  11.9* 10.5*  HCT 37.7 33.2*  MCV 97.2 96.8  PLT 618* 468*   Basic Metabolic Panel: Recent Labs  Lab 02/26/19 1748 02/27/19 0556  NA 139 137  K 3.3* 3.9  CL 105 106  CO2 26 25  GLUCOSE 107* 96  BUN 9 8  CREATININE 0.66 0.74  CALCIUM 9.4 9.2  MG  --  1.7   GFR: Estimated Creatinine Clearance: 107.2 mL/min (by C-G formula based on SCr of 0.74 mg/dL). Liver Function Tests: Recent Labs  Lab 02/26/19 1748 02/27/19 0556  AST 36 29  ALT 51* 41  ALKPHOS 68 60  BILITOT 0.3 0.4  PROT 7.6 6.6  ALBUMIN 3.7 3.2*   Recent Labs  Lab 02/26/19 1748  LIPASE 51   No results for input(s): AMMONIA in the last 168 hours. Coagulation Profile: No results for input(s): INR, PROTIME in the last 168 hours. Cardiac Enzymes: No results for input(s): CKTOTAL, CKMB, CKMBINDEX, TROPONINI in the last 168 hours. BNP (last 3 results) No results for input(s): PROBNP in the last 8760 hours. HbA1C: No results for input(s): HGBA1C in the last 72 hours. CBG: No results for input(s): GLUCAP in the last 168 hours. Lipid Profile: No results for input(s): CHOL, HDL, LDLCALC, TRIG, CHOLHDL, LDLDIRECT in the last 72 hours. Thyroid Function Tests: No results for input(s): TSH, T4TOTAL, FREET4, T3FREE, THYROIDAB in the last 72 hours. Anemia Panel: No results for input(s): VITAMINB12, FOLATE, FERRITIN, TIBC, IRON, RETICCTPCT in the last 72 hours. Sepsis Labs: No results for input(s): PROCALCITON, LATICACIDVEN in the last 168 hours.  Recent Results (from the past 240 hour(s))  SARS Coronavirus 2 by RT PCR (hospital order, [facility]) Nasopharyngeal Nasopharyngeal Swab     Status: None   Collection Time: 02/20/19  5:20 PM   Specimen: Nasopharyngeal Swab  Result Value Ref Range Status   SARS Coronavirus 2 NEGATIVE NEGATIVE Final    Comment: (NOTE) If result is NEGATIVE SARS-CoV-2 target nucleic acids are NOT DETECTED. The SARS-CoV-2 RNA is generally detectable in upper and  lower  respiratory specimens during the acute phase of infection. The lowest  concentration of SARS-CoV-2 viral copies this assay can detect is 250  copies / mL. A negative result does not preclude SARS-CoV-2 infection  and should not be used as the sole basis for treatment or other  patient management decisions.  A negative result may occur with  improper specimen collection / handling, submission of specimen other  than nasopharyngeal swab, presence of viral mutation(s) within the  areas targeted by this assay, and inadequate number of viral copies  (<250 copies / mL). A negative result must be combined with clinical  observations, patient history, and epidemiological information. If result is POSITIVE SARS-CoV-2 target nucleic acids are DETECTED. The SARS-CoV-2 RNA is generally detectable in upper and lower  respiratory specimens dur ing the acute phase of infection.  Positive  results are indicative of active infection with SARS-CoV-2.  Clinical  correlation with patient history and other diagnostic information is  necessary to determine patient infection status.  Positive results do  not rule out bacterial infection or co-infection with other viruses. If result is PRESUMPTIVE POSTIVE SARS-CoV-2 nucleic acids MAY BE PRESENT.   A presumptive positive result was  obtained on the submitted specimen  and confirmed on repeat testing.  While 2019 novel coronavirus  (SARS-CoV-2) nucleic acids may be present in the submitted sample  additional confirmatory testing may be necessary for epidemiological  and / or clinical management purposes  to differentiate between  SARS-CoV-2 and other Sarbecovirus currently known to infect humans.  If clinically indicated additional testing with an alternate test  methodology [phone]) is advised. The SARS-CoV-2 RNA is generally  detectable in upper and lower respiratory sp ecimens during the acute  phase of infection. The expected result is Negative.  Fact Sheet for Patients:  BoilerBrush.com.cy Fact Sheet for Healthcare Providers: https://pope.com/ This test is not yet approved or cleared by the [place] FDA and has been authorized for detection and/or diagnosis of SARS-CoV-2 by FDA under an Emergency Use Authorization (EUA).  This EUA will remain in effect (meaning this test can be used) for the duration of the COVID-19 declaration under Section 564(b)(1) of the Act, 21 U.S.C. section 360bbb-3(b)(1), unless the authorization is terminated or revoked sooner. Performed at [facility], [address], [place], [place] [address]   SARS CORONAVIRUS 2 (TAT 6-24 HRS) Nasopharyngeal Nasopharyngeal Swab     Status: None   Collection Time: 02/26/19 10:19 PM   Specimen: Nasopharyngeal Swab  Result Value Ref Range Status   SARS Coronavirus 2 NEGATIVE NEGATIVE Final    Comment: (NOTE) SARS-CoV-2 target nucleic acids are NOT DETECTED. The SARS-CoV-2 RNA is generally detectable in upper and lower respiratory specimens during the acute phase of infection. Negative results do not preclude SARS-CoV-2 infection, do not rule out co-infections with other pathogens, and should not be used as the sole basis for treatment or other patient management decisions. Negative results must be combined with clinical observations, patient history, and epidemiological information. The expected result is Negative. Fact Sheet for Patients: HairSlick.no Fact Sheet for Healthcare Providers: quierodirigir.com This test is not yet approved or cleared by the [place] FDA and  has been authorized for detection and/or diagnosis of SARS-CoV-2 by FDA under an Emergency Use Authorization (EUA). This EUA will remain  in effect (meaning this test can be used) for the duration of the COVID-19 declaration under Section 56 4(b)(1) of the Act, 21  U.S.C. section 360bbb-3(b)(1), unless the authorization is terminated or revoked sooner. Performed at [facility], [address], [place], [place] [address]      Radiology Studies: US Abdomen Complete  Result Date: 02/26/2019 CLINICAL DATA:  History of renal stone and recent cystoscopy EXAM: ABDOMEN ULTRASOUND COMPLETE COMPARISON:  02/14/2019 FINDINGS: Gallbladder: No gallstones or wall thickening visualized. No sonographic Murphy sign noted by sonographer. Common bile duct: Diameter: 2.5 mm. Liver: No focal lesion identified. Within normal limits in parenchymal echogenicity. Portal vein is patent on color Doppler imaging with normal direction of blood flow towards the liver. IVC: No abnormality visualized. Pancreas: Visualized portion unremarkable. Spleen: Size and appearance within normal limits. Right Kidney: Length: 11.6 cm. 4 mm nonobstructing stone is noted. No mass lesion is noted. Left Kidney: Length: 11.1 cm. 4 mm nonobstructing stone is noted similar to that seen on prior CT. No mass lesion is noted. Abdominal aorta: No aneurysm visualized. Other findings: None. IMPRESSION: Bilateral nonobstructing renal calculi similar to that seen on prior CT examination. No hydronephrosis is noted. Electronically Signed   By: Alcide Clever M.D.   On: 02/26/2019 20:02   Ct Abdomen Pelvis W Contrast  Result Date: 02/26/2019 CLINICAL DATA:  Right upper  quadrant pain status post treatment for nephrolithiasis on 10/29. EXAM: CT ABDOMEN AND PELVIS WITH CONTRAST TECHNIQUE: Multidetector CT imaging of the abdomen and pelvis was performed using the standard protocol following bolus administration of intravenous contrast. CONTRAST:  OMNIPAQUE IOHEXOL 300 MG/ML  SOLN COMPARISON:  02/14/2019 FINDINGS: Lower chest: The lung bases are clear. The heart size is normal. Hepatobiliary: There is decreased hepatic attenuation suggestive of hepatic steatosis. Normal gallbladder.There is no biliary ductal  dilation. Pancreas: There is mild fat stranding about the pancreatic head. There is no drainable peripancreatic fluid collection. Spleen: No splenic laceration or hematoma. Adrenals/Urinary Tract: --Adrenal glands: Normal --Right kidney/ureter: They are is a 3.6 by 1.8 by 3 cm hypoattenuating area in the anterior interpolar region of the right kidney. There is a trace subcapsular correction surrounding the upper pole best visualized on the sagittal images. There is a tiny 1 cm cortical hypodensity in the upper pole best visualized on the sagittal images. Multiple nonobstructing stones are noted. --Left kidney/ureter: There are multiple nonobstructing stones without evidence for hydronephrosis. --Urinary bladder: The bladder is decompressed which limits evaluation. Stomach/Bowel: --Stomach/Duodenum: No hiatal hernia or other gastric abnormality. Normal duodenal course and caliber. --Small bowel: No dilatation or inflammation. --Colon: There is scattered colonic diverticula without CT evidence for diverticulitis --Appendix: Normal. Vascular/Lymphatic: Normal course and caliber of the major abdominal vessels. --No retroperitoneal lymphadenopathy. --No mesenteric lymphadenopathy. --No pelvic or inguinal lymphadenopathy. Reproductive: The patient is status post bilateral tubal ligation. The left tubal ligation clip appears to be malpositioned and located in the posterior pelvis. Other: No ascites or free air. The abdominal wall is normal. Musculoskeletal. No acute displaced fractures. IMPRESSION: 1. Interval resolution of the previously demonstrated right-sided hydronephrosis. 2. Hypoattenuating area in the anterior interpolar region of the right kidney raises concern for right-sided pyelonephritis. Small cystic areas in the interpolar region and upper pole may represent small abscesses. 3. Mild fat stranding about the pancreatic head. Correlation with lipase is recommended to help exclude pancreatitis. 4. Hepatic  steatosis 5. Malpositioned left tubal ligation clip. Electronically Signed   By: Katherine Mantle M.D.   On: 02/26/2019 21:49    Scheduled Meds: . sodium chloride flush  3 mL Intravenous Q12H   Continuous Infusions: . aztreonam 1 g (02/27/19 1337)  . lactated ringers Stopped (02/27/19 0614)     LOS: 1 day    Time spent: 40 minutes.   Arnetha Courser, MD [facility] Pager [phone]  If 7PM-7AM, please contact night-coverage [email or web address] Password [email or web address] 02/27/2019, 3:30 PM

## 2019-02-28 LAB — URINE CULTURE: Culture: NO GROWTH

## 2019-02-28 LAB — CBC
HCT: 34.3 % — ABNORMAL LOW (ref 36.0–46.0)
Hemoglobin: 10.8 g/dL — ABNORMAL LOW (ref 12.0–15.0)
MCH: 30.4 pg (ref 26.0–34.0)
MCHC: 31.5 g/dL (ref 30.0–36.0)
MCV: 96.6 fL (ref 80.0–100.0)
Platelets: 528 10*3/uL — ABNORMAL HIGH (ref 150–400)
RBC: 3.55 MIL/uL — ABNORMAL LOW (ref 3.87–5.11)
RDW: 12.8 % (ref 11.5–15.5)
WBC: 9.7 10*3/uL (ref 4.0–10.5)
nRBC: 0 % (ref 0.0–0.2)

## 2019-02-28 LAB — BASIC METABOLIC PANEL
Anion gap: 9 (ref 5–15)
BUN: 11 mg/dL (ref 6–20)
CO2: 24 mmol/L (ref 22–32)
Calcium: 9 mg/dL (ref 8.9–10.3)
Chloride: 106 mmol/L (ref 98–111)
Creatinine, Ser: 0.62 mg/dL (ref 0.44–1.00)
GFR calc Af Amer: >60 mL/min (ref >60)
GFR calc non Af Amer: >60 mL/min (ref >60)
Glucose, Bld: 105 mg/dL — ABNORMAL HIGH (ref 70–99)
Potassium: 3.8 mmol/L (ref 3.5–5.1)
Sodium: 139 mmol/L (ref 135–145)

## 2019-02-28 MED ORDER — HYDROMORPHONE HCL 1 MG/ML IJ SOLN
1.0000 mg | Freq: Once | INTRAMUSCULAR | Status: AC
  Administered 2019-02-28: 1 mg via INTRAVENOUS
  Filled 2019-02-28: qty 1

## 2019-02-28 NOTE — Progress Notes (Signed)
PROGRESS NOTE    Shannon [name]  ZOX:[id number] DOB: June 22, 1985 DOA: 02/26/2019 PCP: Nathaneil Canary, PA-C   Brief Narrative:  Shannon [name] a 33 y.o.femalewith medical history significant forrecent right-sided ureterolithiasis with hydronephrosis, she underwent cystoscopy with right ureteroscopy and right retrograde pyelogram with Dr. Annabell Howells of urology,which showed no evidence of filling defect as well as no evidence of right ureteral stone, prompting suspicion of a recently passed right ureterolithiasis.The patient was instructed to complete a course of ciprofloxacin and was prescribed as needed Norco as well as as needed Phenergan for any residual nausea.  Patient reported persistent right flank discomfort after her procedure and developed some nausea and vomiting.  She was seen in ED yesterday because of worsening right flank pain and CT scan with right pyelonephritis.  She was admitted for IV antibiotics as she failed outpatient ciprofloxacin. She was started on aztreonam due to her penicillin allergies.  Subjective: Better when seen this morning.  Flank pain is improving.  Remained afebrile.  Able to eat and drink without any nausea or vomiting.  Assessment & Plan:   Principal Problem:   Pyelonephritis of right kidney Active Problems:   Right flank pain   Nausea & vomiting   Hypokalemia  Right pyelonephritis.  Repeat CT scanning yesterday shows multiple nonobstructed bilateral stones with right pyelonephritis and possible small abscess.  Patient recently has a urological procedure due to nephrolithiasis.  Failed 10 days course of ciprofloxacin as an outpatient.  Talked with Dr. Sherron Monday from urology who advised to continue treating her pyelonephritis with IV antibiotics and follow-up with Dr. Annabell Howells as an outpatient according to her scheduled appointment on 03/08/19. Patient remained afebrile with improving leukocytosis. Urine and blood culture negative, previously she  was positive for pretty sensitive E. Coli in January 2020.  No urine culture during her recent course of nephrolithiasis. -Clinically improving.  Discussed with Dr. Daiva Eves from ID as she is having sterile urine, based on her previous culture which was done 6 months ago he recommended keeping her on Bactrim on discharge for 1 month. -If patient continues to improve, can be discharged tomorrow on Bactrim for 1 month and she will follow up with urology according to her scheduled appointment.  Objective: Vitals:   02/27/19 1338 02/27/19 2011 02/28/19 0423 02/28/19 1344  BP: 101/74 114/72 116/76 110/71  Pulse: 78 66 76 60  Resp: Temp: 98.5 F (36.9 C) 98.6 F (37 C) 98.1 F (36.7 C) 98.7 F (37.1 C)  TempSrc: Oral Oral Oral Oral  SpO2: 100% 99% 99% 100%  Weight:      Height:        Intake/Output Summary (Last 24 hours) at 02/28/2019 1540 Last data filed at 02/28/2019 1315 Gross per 24 hour  Intake 2824.2 ml  Output -  Net 2824.2 ml   Filed Weights   02/27/19 0539  Weight: 80.4 kg    Examination:  General exam: Appears calm and comfortable  Respiratory system: Clear to auscultation. Respiratory effort normal. Cardiovascular system: S1 & S2 heard, RRR. No JVD, murmurs, rubs, gallops or clicks. No pedal edema. Gastrointestinal system: Mild right flank tenderness.  Abdomen is nondistended, soft and nontender. Normal bowel sounds heard. Central nervous system: Alert and oriented. No focal neurological deficits. Extremities: Symmetric 5 x 5 power. Skin: No rashes, lesions or ulcers Psychiatry: Judgement and insight appear normal. Mood & affect appropriate.   DVT prophylaxis: SCDs Code Status: Full Family Communication: Discussed with patient, no family at  bedside. Disposition Plan:  Likely home tomorrow.  Procedures:  Antimicrobials:  Aztreonam-day 3  Data Reviewed: I have personally reviewed following labs and imaging studies  CBC: Recent Labs  Lab  02/26/19 1748 02/27/19 0556 02/28/19 0531  WBC 12.8* 10.5 9.7  NEUTROABS  --  6.4  --   HGB 11.9* 10.5* 10.8*  HCT 37.7 33.2* 34.3*  MCV 97.2 96.8 96.6  PLT 618* 468* 528*   Basic Metabolic Panel: Recent Labs  Lab 02/26/19 1748 02/27/19 0556 02/28/19 0531  NA 139 137 139  K 3.3* 3.9 3.8  CL 105 106 106  CO2 26 25 24   GLUCOSE 107* 96 105*  BUN 9 8 11   CREATININE 0.66 0.74 0.62  CALCIUM 9.4 9.2 9.0  MG  --  1.7  --    GFR: Estimated Creatinine Clearance: 107.2 mL/min (by C-G formula based on SCr of 0.62 mg/dL). Liver Function Tests: Recent Labs  Lab 02/26/19 1748 02/27/19 0556  AST 36 29  ALT 51* 41  ALKPHOS 68 60  BILITOT 0.3 0.4  PROT 7.6 6.6  ALBUMIN 3.7 3.2*   Recent Labs  Lab 02/26/19 1748  LIPASE 51   No results for input(s): AMMONIA in the last 168 hours. Coagulation Profile: No results for input(s): INR, PROTIME in the last 168 hours. Cardiac Enzymes: No results for input(s): CKTOTAL, CKMB, CKMBINDEX, TROPONINI in the last 168 hours. BNP (last 3 results) No results for input(s): PROBNP in the last 8760 hours. HbA1C: No results for input(s): HGBA1C in the last 72 hours. CBG: No results for input(s): GLUCAP in the last 168 hours. Lipid Profile: No results for input(s): CHOL, HDL, LDLCALC, TRIG, CHOLHDL, LDLDIRECT in the last 72 hours. Thyroid Function Tests: No results for input(s): TSH, T4TOTAL, FREET4, T3FREE, THYROIDAB in the last 72 hours. Anemia Panel: No results for input(s): VITAMINB12, FOLATE, FERRITIN, TIBC, IRON, RETICCTPCT in the last 72 hours. Sepsis Labs: No results for input(s): PROCALCITON, LATICACIDVEN in the last 168 hours.  Recent Results (from the past 240 hour(s))  SARS Coronavirus 2 by RT PCR (hospital order, performed in [facility]) Nasopharyngeal Nasopharyngeal Swab     Status: None   Collection Time: 02/20/19  5:20 PM   Specimen: Nasopharyngeal Swab  Result Value Ref Range Status   SARS Coronavirus 2  NEGATIVE NEGATIVE Final    Comment: (NOTE) If result is NEGATIVE SARS-CoV-2 target nucleic acids are NOT DETECTED. The SARS-CoV-2 RNA is generally detectable in upper and lower  respiratory specimens during the acute phase of infection. The lowest  concentration of SARS-CoV-2 viral copies this assay can detect is 250  copies / mL. A negative result does not preclude SARS-CoV-2 infection  and should not be used as the sole basis for treatment or other  patient management decisions.  A negative result may occur with  improper specimen collection / handling, submission of specimen other  than nasopharyngeal swab, presence of viral mutation(s) within the  areas targeted by this assay, and inadequate number of viral copies  (<250 copies / mL). A negative result must be combined with clinical  observations, patient history, and epidemiological information. If result is POSITIVE SARS-CoV-2 target nucleic acids are DETECTED. The SARS-CoV-2 RNA is generally detectable in upper and lower  respiratory specimens dur ing the acute phase of infection.  Positive  results are indicative of active infection with SARS-CoV-2.  Clinical  correlation with patient history and other diagnostic information is  necessary to determine patient infection status.  Positive  results do  not rule out bacterial infection or co-infection with other viruses. If result is PRESUMPTIVE POSTIVE SARS-CoV-2 nucleic acids MAY BE PRESENT.   A presumptive positive result was obtained on the submitted specimen  and confirmed on repeat testing.  While 2019 novel coronavirus  (SARS-CoV-2) nucleic acids may be present in the submitted sample  additional confirmatory testing may be necessary for epidemiological  and / or clinical management purposes  to differentiate between  SARS-CoV-2 and other Sarbecovirus currently known to infect humans.  If clinically indicated additional testing with an alternate test  methodology [phone])  is advised. The SARS-CoV-2 RNA is generally  detectable in upper and lower respiratory sp ecimens during the acute  phase of infection. The expected result is Negative. Fact Sheet for Patients:  BoilerBrush.com.cy Fact Sheet for Healthcare Providers: https://pope.com/ This test is not yet approved or cleared by the [place] FDA and has been authorized for detection and/or diagnosis of SARS-CoV-2 by FDA under an Emergency Use Authorization (EUA).  This EUA will remain in effect (meaning this test can be used) for the duration of the COVID-19 declaration under Section 564(b)(1) of the Act, 21 U.S.C. section 360bbb-3(b)(1), unless the authorization is terminated or revoked sooner. Performed at [facility], [address], [place], [place] [address]   Urine culture     Status: None   Collection Time: 02/26/19  6:16 PM   Specimen: Urine, Random  Result Value Ref Range Status   Specimen Description   Final    URINE, RANDOM Performed at [facility], [address], [place], [place] [address]    Special Requests   Final    NONE Performed at [facility], [address], [place], [place] [address]    Culture   Final    NO GROWTH Performed at [facility], [address] [place], [place] [address]    Report Status 02/28/2019 FINAL  Final  SARS CORONAVIRUS 2 (TAT 6-24 HRS) Nasopharyngeal Nasopharyngeal Swab     Status: None   Collection Time: 02/26/19 10:19 PM   Specimen: Nasopharyngeal Swab  Result Value Ref Range Status   SARS Coronavirus 2 NEGATIVE NEGATIVE Final    Comment: (NOTE) SARS-CoV-2 target nucleic acids are NOT DETECTED. The SARS-CoV-2 RNA is generally detectable in upper and lower respiratory specimens during the acute phase of infection. Negative results do not preclude SARS-CoV-2 infection, do not rule out co-infections with other pathogens, and  should not be used as the sole basis for treatment or other patient management decisions. Negative results must be combined with clinical observations, patient history, and epidemiological information. The expected result is Negative. Fact Sheet for Patients: HairSlick.no Fact Sheet for Healthcare Providers: quierodirigir.com This test is not yet approved or cleared by the [place] FDA and  has been authorized for detection and/or diagnosis of SARS-CoV-2 by FDA under an Emergency Use Authorization (EUA). This EUA will remain  in effect (meaning this test can be used) for the duration of the COVID-19 declaration under Section 56 4(b)(1) of the Act, 21 U.S.C. section 360bbb-3(b)(1), unless the authorization is terminated or revoked sooner. Performed at [facility], [address] [place], [place] [address]   Culture, blood (routine x 2)     Status: None (Preliminary result)   Collection Time: 02/27/19  5:56 AM   Specimen: BLOOD  Result Value Ref Range Status   Specimen Description   Final    BLOOD RIGHT ANTECUBITAL  Performed at [facility], [address], [place], [place] [address]    Special Requests   Final    BOTTLES DRAWN AEROBIC AND ANAEROBIC Blood Culture adequate volume Performed at [facility], [address] [place], [place] [address]    Culture   Final    NO GROWTH < 24 HOURS Performed at [facility], [address] [place], [place] [address]    Report Status PENDING  Incomplete  Culture, blood (routine x 2)     Status: None (Preliminary result)   Collection Time: 02/27/19  5:56 AM   Specimen: BLOOD  Result Value Ref Range Status   Specimen Description   Final    BLOOD BLOOD RIGHT HAND Performed at [facility], [address] [place], [place] [address]    Special Requests   Final    BOTTLES DRAWN AEROBIC ONLY Blood Culture  adequate volume Performed at [facility], [address], [place], [place] [address]    Culture   Final    NO GROWTH < 24 HOURS Performed at [facility], [address], [place], [place] [address]    Report Status PENDING  Incomplete     Radiology Studies: US Abdomen Complete  Result Date: 02/26/2019 CLINICAL DATA:  History of renal stone and recent cystoscopy EXAM: ABDOMEN ULTRASOUND COMPLETE COMPARISON:  02/14/2019 FINDINGS: Gallbladder: No gallstones or wall thickening visualized. No sonographic Murphy sign noted by sonographer. Common bile duct: Diameter: 2.5 mm. Liver: No focal lesion identified. Within normal limits in parenchymal echogenicity. Portal vein is patent on color Doppler imaging with normal direction of blood flow towards the liver. IVC: No abnormality visualized. Pancreas: Visualized portion unremarkable. Spleen: Size and appearance within normal limits. Right Kidney: Length: 11.6 cm. 4 mm nonobstructing stone is noted. No mass lesion is noted. Left Kidney: Length: 11.1 cm. 4 mm nonobstructing stone is noted similar to that seen on prior CT. No mass lesion is noted. Abdominal aorta: No aneurysm visualized. Other findings: None. IMPRESSION: Bilateral nonobstructing renal calculi similar to that seen on prior CT examination. No hydronephrosis is noted. Electronically Signed   By: Alcide Clever M.D.   On: 02/26/2019 20:02   Ct Abdomen Pelvis W Contrast  Result Date: 02/26/2019 CLINICAL DATA:  Right upper quadrant pain status post treatment for nephrolithiasis on 10/29. EXAM: CT ABDOMEN AND PELVIS WITH CONTRAST TECHNIQUE: Multidetector CT imaging of the abdomen and pelvis was performed using the standard protocol following bolus administration of intravenous contrast. CONTRAST:  OMNIPAQUE IOHEXOL 300 MG/ML  SOLN COMPARISON:  02/14/2019 FINDINGS: Lower chest: The lung bases are clear. The heart size is normal. Hepatobiliary: There is decreased hepatic  attenuation suggestive of hepatic steatosis. Normal gallbladder.There is no biliary ductal dilation. Pancreas: There is mild fat stranding about the pancreatic head. There is no drainable peripancreatic fluid collection. Spleen: No splenic laceration or hematoma. Adrenals/Urinary Tract: --Adrenal glands: Normal --Right kidney/ureter: They are is a 3.6 by 1.8 by 3 cm hypoattenuating area in the anterior interpolar region of the right kidney. There is a trace subcapsular correction surrounding the upper pole best visualized on the sagittal images. There is a tiny 1 cm cortical hypodensity in the upper pole best visualized on the sagittal images. Multiple nonobstructing stones are noted. --Left kidney/ureter: There are multiple nonobstructing stones without evidence for hydronephrosis. --Urinary bladder: The bladder is decompressed which limits evaluation. Stomach/Bowel: --Stomach/Duodenum: No hiatal hernia or other gastric abnormality. Normal duodenal course  and caliber. --Small bowel: No dilatation or inflammation. --Colon: There is scattered colonic diverticula without CT evidence for diverticulitis --Appendix: Normal. Vascular/Lymphatic: Normal course and caliber of the major abdominal vessels. --No retroperitoneal lymphadenopathy. --No mesenteric lymphadenopathy. --No pelvic or inguinal lymphadenopathy. Reproductive: The patient is status post bilateral tubal ligation. The left tubal ligation clip appears to be malpositioned and located in the posterior pelvis. Other: No ascites or free air. The abdominal wall is normal. Musculoskeletal. No acute displaced fractures. IMPRESSION: 1. Interval resolution of the previously demonstrated right-sided hydronephrosis. 2. Hypoattenuating area in the anterior interpolar region of the right kidney raises concern for right-sided pyelonephritis. Small cystic areas in the interpolar region and upper pole may represent small abscesses. 3. Mild fat stranding about the pancreatic  head. Correlation with lipase is recommended to help exclude pancreatitis. 4. Hepatic steatosis 5. Malpositioned left tubal ligation clip. Electronically Signed   By: Constance Holster M.D.   On: 02/26/2019 21:49    Scheduled Meds: . sodium chloride flush  3 mL Intravenous Q12H   Continuous Infusions: . aztreonam 1 g (02/28/19 1354)     LOS: 2 days   Time spent: 35 minutes  Lorella Nimrod, MD [facility] Pager [phone]  If 7PM-7AM, please contact night-coverage [email or web address] Password [facility] 02/28/2019, 3:40 PM   This record has been created using Systems analyst. Errors have been sought and corrected,but may not always be located. Such creation errors do not reflect on the standard of care.

## 2019-02-28 NOTE — Progress Notes (Signed)
Patient had an occurrence of pain in her bladder area rating 5/10 pain, unrelieved by toradol.  Doctor Reesa Chew notified with one time order for dilaudid.

## 2019-03-01 MED ORDER — SULFAMETHOXAZOLE-TRIMETHOPRIM 800-160 MG PO TABS
1.0000 | ORAL_TABLET | Freq: Two times a day (BID) | ORAL | Status: DC
  Administered 2019-03-01: 10:00:00 1 via ORAL
  Filled 2019-03-01 (×2): qty 1

## 2019-03-01 MED ORDER — SULFAMETHOXAZOLE-TRIMETHOPRIM 800-160 MG PO TABS: 1.0000 | ORAL_TABLET | Freq: Two times a day (BID) | ORAL | 0 refills | Status: AC

## 2019-03-01 NOTE — Discharge Summary (Signed)
Physician Discharge Summary  Shannon [name] ZOX:[id number] DOB: March 01, 1986 DOA: 02/26/2019  PCP: Nathaneil Canary, PA-C  Admit date: 02/26/2019 Discharge date: 03/01/2019  Admitted From: Home Disposition: Home  Recommendations for Outpatient Follow-up:  1. Follow up with PCP in 1-2 weeks 2. Please obtain BMP/CBC in one week 3. Please follow up on the following pending results:  Home Health: No Equipment/Devices: None Discharge Condition: Stable CODE STATUS: Full Diet recommendation: Regular  Brief/Interim Summary: Shannon [name] a 33 y.o.femalewith medical history significant forrecent right-sided ureterolithiasis with hydronephrosis, she underwent cystoscopy with right ureteroscopy and right retrograde pyelogram with Dr. Annabell Howells of urology,which showed no evidence of filling defect as well as no evidence of right ureteral stone, prompting suspicion of a recently passed right ureterolithiasis.The patient was instructed to complete a course of ciprofloxacin and was prescribed as needed Norco as well as as needed Phenergan for any residual nausea.  Patient reported persistent right flank discomfort after her procedure and developed some nausea and vomiting. CT abdomen was consistent with pyelonephritis and possible small abscesses. She was treated with aztreonam due to her penicillin allergies for 3 days.  Urine and blood culture remained negative.  She had a 30-month prior urine culture which was positive for E. coli with good sensitivity.  After discussion with ID she was discharged home on a 4-week course of Bactrim.  She will follow up with urology on 03/08/19.  Discharge Diagnoses:  Principal Problem:   Pyelonephritis of right kidney Active Problems:   Right flank pain   Nausea & vomiting   Hypokalemia  Discharge Instructions  Discharge Instructions    Diet - low sodium heart healthy   Complete by: As directed    Discharge instructions   Complete by: As directed     It was pleasure taking care of you. Please follow-up with urology according to your scheduled appointment. I am giving you a prescription for Bactrim, she will take this antibiotics for 4 weeks. Please keep yourself well-hydrated.   Increase activity slowly   Complete by: As directed      Allergies as of 03/01/2019      Reactions   Penicillins Hives   DID THE REACTION INVOLVE: Swelling of the face/tongue/throat, SOB, or low BP? No Sudden or severe rash/hives, skin peeling, or the inside of the mouth or nose? Yes Did it require medical treatment? No When did it last happen?Over 10 years ago If all above answers are "NO", may proceed with cephalosporin use.   Codeine Itching      Medication List    STOP taking these medications   ciprofloxacin 500 MG tablet Commonly known as: CIPRO   trimethoprim 100 MG tablet Commonly known as: TRIMPEX     TAKE these medications   HYDROcodone-acetaminophen 5-325 MG tablet Commonly known as: NORCO/VICODIN Take 1 tablet by mouth every 6 (six) hours as needed for moderate pain.   ibuprofen 800 MG tablet Commonly known as: ADVIL Take 1 tablet (800 mg total) by mouth every 8 (eight) hours as needed.   promethazine 25 MG tablet Commonly known as: PHENERGAN Take 1 tablet (25 mg total) by mouth every 8 (eight) hours as needed for nausea or vomiting.   sulfamethoxazole-trimethoprim 800-160 MG tablet Commonly known as: BACTRIM DS Take 1 tablet by mouth every 12 (twelve) hours for 28 days.       Allergies  Allergen Reactions  . Penicillins Hives    DID THE REACTION INVOLVE: Swelling of the face/tongue/throat, SOB, or low BP? No Sudden  or severe rash/hives, skin peeling, or the inside of the mouth or nose? Yes Did it require medical treatment? No When did it last happen?Over 10 years ago If all above answers are "NO", may proceed with cephalosporin use.  . Codeine Itching    Procedures/Studies: US Abdomen Complete  Result Date:  02/26/2019 CLINICAL DATA:  History of renal stone and recent cystoscopy EXAM: ABDOMEN ULTRASOUND COMPLETE COMPARISON:  02/14/2019 FINDINGS: Gallbladder: No gallstones or wall thickening visualized. No sonographic Murphy sign noted by sonographer. Common bile duct: Diameter: 2.5 mm. Liver: No focal lesion identified. Within normal limits in parenchymal echogenicity. Portal vein is patent on color Doppler imaging with normal direction of blood flow towards the liver. IVC: No abnormality visualized. Pancreas: Visualized portion unremarkable. Spleen: Size and appearance within normal limits. Right Kidney: Length: 11.6 cm. 4 mm nonobstructing stone is noted. No mass lesion is noted. Left Kidney: Length: 11.1 cm. 4 mm nonobstructing stone is noted similar to that seen on prior CT. No mass lesion is noted. Abdominal aorta: No aneurysm visualized. Other findings: None. IMPRESSION: Bilateral nonobstructing renal calculi similar to that seen on prior CT examination. No hydronephrosis is noted. Electronically Signed   By: Alcide Clever M.D.   On: 02/26/2019 20:02   Ct Abdomen Pelvis W Contrast  Result Date: 02/26/2019 CLINICAL DATA:  Right upper quadrant pain status post treatment for nephrolithiasis on 10/29. EXAM: CT ABDOMEN AND PELVIS WITH CONTRAST TECHNIQUE: Multidetector CT imaging of the abdomen and pelvis was performed using the standard protocol following bolus administration of intravenous contrast. CONTRAST:  OMNIPAQUE IOHEXOL 300 MG/ML  SOLN COMPARISON:  02/14/2019 FINDINGS: Lower chest: The lung bases are clear. The heart size is normal. Hepatobiliary: There is decreased hepatic attenuation suggestive of hepatic steatosis. Normal gallbladder.There is no biliary ductal dilation. Pancreas: There is mild fat stranding about the pancreatic head. There is no drainable peripancreatic fluid collection. Spleen: No splenic laceration or hematoma. Adrenals/Urinary Tract: --Adrenal glands: Normal --Right  kidney/ureter: They are is a 3.6 by 1.8 by 3 cm hypoattenuating area in the anterior interpolar region of the right kidney. There is a trace subcapsular correction surrounding the upper pole best visualized on the sagittal images. There is a tiny 1 cm cortical hypodensity in the upper pole best visualized on the sagittal images. Multiple nonobstructing stones are noted. --Left kidney/ureter: There are multiple nonobstructing stones without evidence for hydronephrosis. --Urinary bladder: The bladder is decompressed which limits evaluation. Stomach/Bowel: --Stomach/Duodenum: No hiatal hernia or other gastric abnormality. Normal duodenal course and caliber. --Small bowel: No dilatation or inflammation. --Colon: There is scattered colonic diverticula without CT evidence for diverticulitis --Appendix: Normal. Vascular/Lymphatic: Normal course and caliber of the major abdominal vessels. --No retroperitoneal lymphadenopathy. --No mesenteric lymphadenopathy. --No pelvic or inguinal lymphadenopathy. Reproductive: The patient is status post bilateral tubal ligation. The left tubal ligation clip appears to be malpositioned and located in the posterior pelvis. Other: No ascites or free air. The abdominal wall is normal. Musculoskeletal. No acute displaced fractures. IMPRESSION: 1. Interval resolution of the previously demonstrated right-sided hydronephrosis. 2. Hypoattenuating area in the anterior interpolar region of the right kidney raises concern for right-sided pyelonephritis. Small cystic areas in the interpolar region and upper pole may represent small abscesses. 3. Mild fat stranding about the pancreatic head. Correlation with lipase is recommended to help exclude pancreatitis. 4. Hepatic steatosis 5. Malpositioned left tubal ligation clip. Electronically Signed   By: Katherine Mantle M.D.   On: 02/26/2019 21:49   Ct Abdomen Pelvis  W Contrast  Result Date: 02/14/2019 CLINICAL DATA:  Acute onset abdominal pain and  nausea and vomiting today. EXAM: CT ABDOMEN AND PELVIS WITH CONTRAST TECHNIQUE: Multidetector CT imaging of the abdomen and pelvis was performed using the standard protocol following bolus administration of intravenous contrast. CONTRAST:  OMNIPAQUE IOHEXOL 300 MG/ML  SOLN COMPARISON:  None. FINDINGS: Lower Chest: No acute findings. Hepatobiliary: No hepatic masses identified. Gallbladder is unremarkable. No evidence of biliary ductal dilatation. Pancreas:  No mass or inflammatory changes. Spleen: Within normal limits in size and appearance. Adrenals/Urinary Tract: Small less than 5 mm renal calculi are seen in both kidneys. Mild right hydroureteronephrosis and perinephric stranding is seen. No ureteral calculi are identified, however a 3 mm calculus is seen in the urinary bladder, consistent with a recently passed calculus. A sub-cm right renal cyst is noted, however there is no evidence of renal mass or abscess. Stomach/Bowel: No evidence of obstruction, inflammatory process or abnormal fluid collections. Diverticulosis is seen mainly involving the descending and sigmoid colon, however there is no evidence of diverticulitis. Vascular/Lymphatic: No pathologically enlarged lymph nodes. No abdominal aortic aneurysm. Reproductive: No mass or other significant abnormality. Clips from previous bilateral tubal ligation noted. Other:  None. Musculoskeletal:  No suspicious bone lesions identified. IMPRESSION: Mild right hydroureteronephrosis and perinephric stranding. 3 mm calculus in urinary bladder, consistent with recently passed ureteral calculus. Bilateral nephrolithiasis. Colonic diverticulosis, without radiographic evidence of diverticulitis. Electronically Signed   By: Danae Orleans M.D.   On: 02/14/2019 19:58   Dg C-arm 1-60 Min-no Report  Result Date: 02/20/2019 Fluoroscopy was utilized by the requesting physician.  No radiographic interpretation.    Subjective: She was feeling better when seen this  morning.  No new complaints and would like to go home.  Discharge Exam: Vitals:   03/01/19 0300 03/01/19 0550  BP:  105/73  Pulse:  65  Resp:  18  Temp: 98.2 F (36.8 C) 98.9 F (37.2 C)  SpO2:  99%   Vitals:   02/28/19 1344 02/28/19 2000 03/01/19 0300 03/01/19 0550  BP: 110/71 120/87  105/73  Pulse: 60 66  65  Resp: Temp: 98.7 F (37.1 C) 97.9 F (36.6 C) 98.2 F (36.8 C) 98.9 F (37.2 C)  TempSrc: Oral Oral Oral Oral  SpO2: 100% 99%  99%  Weight:      Height:       General: Pt is alert, awake, not in acute distress Cardiovascular: RRR, S1/S2 +, no rubs, no gallops Respiratory: CTA bilaterally, no wheezing, no rhonchi Abdominal: Soft, NT, ND, bowel sounds + Extremities: no edema, no cyanosis  The results of significant diagnostics from this hospitalization (including imaging, microbiology, ancillary and laboratory) are listed below for reference.     Microbiology: Recent Results (from the past 240 hour(s))  SARS Coronavirus 2 by RT PCR (hospital order, performed in [facility] hospital lab) Nasopharyngeal Nasopharyngeal Swab     Status: None   Collection Time: 02/20/19  5:20 PM   Specimen: Nasopharyngeal Swab  Result Value Ref Range Status   SARS Coronavirus 2 NEGATIVE NEGATIVE Final    Comment: (NOTE) If result is NEGATIVE SARS-CoV-2 target nucleic acids are NOT DETECTED. The SARS-CoV-2 RNA is generally detectable in upper and lower  respiratory specimens during the acute phase of infection. The lowest  concentration of SARS-CoV-2 viral copies this assay can detect is 250  copies / mL. A negative result does not preclude SARS-CoV-2 infection  and should not be  used as the sole basis for treatment or other  patient management decisions.  A negative result may occur with  improper specimen collection / handling, submission of specimen other  than nasopharyngeal swab, presence of viral mutation(s) within the  areas targeted by this assay, and  inadequate number of viral copies  (<250 copies / mL). A negative result must be combined with clinical  observations, patient history, and epidemiological information. If result is POSITIVE SARS-CoV-2 target nucleic acids are DETECTED. The SARS-CoV-2 RNA is generally detectable in upper and lower  respiratory specimens dur ing the acute phase of infection.  Positive  results are indicative of active infection with SARS-CoV-2.  Clinical  correlation with patient history and other diagnostic information is  necessary to determine patient infection status.  Positive results do  not rule out bacterial infection or co-infection with other viruses. If result is PRESUMPTIVE POSTIVE SARS-CoV-2 nucleic acids MAY BE PRESENT.   A presumptive positive result was obtained on the submitted specimen  and confirmed on repeat testing.  While 2019 novel coronavirus  (SARS-CoV-2) nucleic acids may be present in the submitted sample  additional confirmatory testing may be necessary for epidemiological  and / or clinical management purposes  to differentiate between  SARS-CoV-2 and other Sarbecovirus currently known to infect humans.  If clinically indicated additional testing with an alternate test  methodology [phone]) is advised. The SARS-CoV-2 RNA is generally  detectable in upper and lower respiratory sp ecimens during the acute  phase of infection. The expected result is Negative. Fact Sheet for Patients:  BoilerBrush.com.cy Fact Sheet for Healthcare Providers: https://pope.com/ This test is not yet approved or cleared by the [place] FDA and has been authorized for detection and/or diagnosis of SARS-CoV-2 by FDA under an Emergency Use Authorization (EUA).  This EUA will remain in effect (meaning this test can be used) for the duration of the COVID-19 declaration under Section 564(b)(1) of the Act, 21 U.S.C. section 360bbb-3(b)(1), unless the  authorization is terminated or revoked sooner. Performed at [facility], [address] [place], [place] [address]   Urine culture     Status: None   Collection Time: 02/26/19  6:16 PM   Specimen: Urine, Random  Result Value Ref Range Status   Specimen Description   Final    URINE, RANDOM Performed at [facility], [address], [place], [place] [address]    Special Requests   Final    NONE Performed at [facility], [address] [place], [place] [address]    Culture   Final    NO GROWTH Performed at [facility], [address] [place], [place] [address]    Report Status 02/28/2019 FINAL  Final  SARS CORONAVIRUS 2 (TAT 6-24 HRS) Nasopharyngeal Nasopharyngeal Swab     Status: None   Collection Time: 02/26/19 10:19 PM   Specimen: Nasopharyngeal Swab  Result Value Ref Range Status   SARS Coronavirus 2 NEGATIVE NEGATIVE Final    Comment: (NOTE) SARS-CoV-2 target nucleic acids are NOT DETECTED. The SARS-CoV-2 RNA is generally detectable in upper and lower respiratory specimens during the acute phase of infection. Negative results do not preclude SARS-CoV-2 infection, do not rule out co-infections with other pathogens, and should not be used as the sole basis for treatment or other patient management decisions. Negative results must be combined with clinical observations, patient history, and epidemiological information. The expected result is Negative. Fact Sheet for Patients: HairSlick.no Fact Sheet  for Healthcare Providers: quierodirigir.com This test is not yet approved or cleared by the [place] and  has been authorized for detection and/or diagnosis of SARS-CoV-2 by FDA under an Emergency Use Authorization (EUA). This EUA will remain  in effect (meaning this test can be used) for the duration of the COVID-19 declaration under Section  56 4(b)(1) of the Act, 21 U.S.C. section 360bbb-3(b)(1), unless the authorization is terminated or revoked sooner. Performed at [facility], [address], [place], [place] [address]   Culture, blood (routine x 2)     Status: None (Preliminary result)   Collection Time: 02/27/19  5:56 AM   Specimen: BLOOD  Result Value Ref Range Status   Specimen Description   Final    BLOOD RIGHT ANTECUBITAL Performed at [facility], [address] [place], [place] [address]    Special Requests   Final    BOTTLES DRAWN AEROBIC AND ANAEROBIC Blood Culture adequate volume Performed at [facility], [address], [place], [place] [address]    Culture   Final    NO GROWTH 2 DAYS Performed at [facility], [address], [place], [place] [address]    Report Status PENDING  Incomplete  Culture, blood (routine x 2)     Status: None (Preliminary result)   Collection Time: 02/27/19  5:56 AM   Specimen: BLOOD  Result Value Ref Range Status   Specimen Description   Final    BLOOD BLOOD RIGHT HAND Performed at [facility], [address], [place], [place] [address]    Special Requests   Final    BOTTLES DRAWN AEROBIC ONLY Blood Culture adequate volume Performed at [facility], [address] [place], [place] [address]    Culture   Final    NO GROWTH 2 DAYS Performed at [facility], [address], [place], [place] [address]    Report Status PENDING  Incomplete     Labs: BNP (last 3 results) No results for input(s): BNP in the last 8760 hours. Basic Metabolic Panel: Recent Labs  Lab 02/26/19 1748 02/27/19 0556 02/28/19 0531  NA 139 137 139  K 3.3* 3.9 3.8  CL 105 106 106  CO2 GLUCOSE 107* 96 105*  BUN CREATININE 0.66 0.74 0.62  CALCIUM 9.4 9.2 9.0  MG  --  1.7  --    Liver Function Tests: Recent Labs  Lab 02/26/19 1748 02/27/19 0556  AST 36 29  ALT 51* 41   ALKPHOS 68 60  BILITOT 0.3 0.4  PROT 7.6 6.6  ALBUMIN 3.7 3.2*   Recent Labs  Lab 02/26/19 1748  LIPASE 51   No results for input(s): AMMONIA in the last 168 hours. CBC: Recent Labs  Lab 02/26/19 1748 02/27/19 0556 02/28/19 0531  WBC 12.8* 10.5 9.7  NEUTROABS  --  6.4  --   HGB 11.9* 10.5* 10.8*  HCT 37.7 33.2* 34.3*  MCV 97.2 96.8 96.6  PLT 618* 468* 528*   Cardiac Enzymes: No results for input(s): CKTOTAL, CKMB, CKMBINDEX, TROPONINI in the last 168 hours. BNP: Invalid input(s): POCBNP CBG: No results for input(s): GLUCAP in the last 168 hours. D-Dimer No results for input(s): DDIMER in the last 72 hours. Hgb A1c No results for input(s): HGBA1C in the last 72 hours. Lipid Profile No results for input(s): CHOL, HDL, LDLCALC, TRIG, CHOLHDL, LDLDIRECT in the last 72  hours. Thyroid function studies No results for input(s): TSH, T4TOTAL, T3FREE, THYROIDAB in the last 72 hours.  Invalid input(s): FREET3 Anemia work up No results for input(s): VITAMINB12, FOLATE, FERRITIN, TIBC, IRON, RETICCTPCT in the last 72 hours. Urinalysis    Component Value Date/Time   COLORURINE YELLOW 02/26/2019 1816   APPEARANCEUR CLEAR 02/26/2019 1816   LABSPEC 1.011 02/26/2019 1816   PHURINE 6.0 02/26/2019 1816   GLUCOSEU NEGATIVE 02/26/2019 1816   HGBUR NEGATIVE 02/26/2019 1816   BILIRUBINUR NEGATIVE 02/26/2019 1816   KETONESUR NEGATIVE 02/26/2019 1816   PROTEINUR NEGATIVE 02/26/2019 1816   UROBILINOGEN 0.2 08/15/2011 1440   NITRITE NEGATIVE 02/26/2019 1816   LEUKOCYTESUR TRACE (A) 02/26/2019 1816   Sepsis Labs Invalid input(s): PROCALCITONIN,  WBC,  LACTICIDVEN Microbiology Recent Results (from the past 240 hour(s))  SARS Coronavirus 2 by RT PCR (hospital order, performed in [facility] hospital lab) Nasopharyngeal Nasopharyngeal Swab     Status: None   Collection Time: 02/20/19  5:20 PM   Specimen: Nasopharyngeal Swab  Result Value Ref Range Status   SARS Coronavirus 2  NEGATIVE NEGATIVE Final    Comment: (NOTE) If result is NEGATIVE SARS-CoV-2 target nucleic acids are NOT DETECTED. The SARS-CoV-2 RNA is generally detectable in upper and lower  respiratory specimens during the acute phase of infection. The lowest  concentration of SARS-CoV-2 viral copies this assay can detect is 250  copies / mL. A negative result does not preclude SARS-CoV-2 infection  and should not be used as the sole basis for treatment or other  patient management decisions.  A negative result may occur with  improper specimen collection / handling, submission of specimen other  than nasopharyngeal swab, presence of viral mutation(s) within the  areas targeted by this assay, and inadequate number of viral copies  (<250 copies / mL). A negative result must be combined with clinical  observations, patient history, and epidemiological information. If result is POSITIVE SARS-CoV-2 target nucleic acids are DETECTED. The SARS-CoV-2 RNA is generally detectable in upper and lower  respiratory specimens dur ing the acute phase of infection.  Positive  results are indicative of active infection with SARS-CoV-2.  Clinical  correlation with patient history and other diagnostic information is  necessary to determine patient infection status.  Positive results do  not rule out bacterial infection or co-infection with other viruses. If result is PRESUMPTIVE POSTIVE SARS-CoV-2 nucleic acids MAY BE PRESENT.   A presumptive positive result was obtained on the submitted specimen  and confirmed on repeat testing.  While 2019 novel coronavirus  (SARS-CoV-2) nucleic acids may be present in the submitted sample  additional confirmatory testing may be necessary for epidemiological  and / or clinical management purposes  to differentiate between  SARS-CoV-2 and other Sarbecovirus currently known to infect humans.  If clinically indicated additional testing with an alternate test  methodology [phone]([id number])  is advised. The SARS-CoV-2 RNA is generally  detectable in upper and lower respiratory sp ecimens during the acute  phase of infection. The expected result is Negative. Fact Sheet for Patients:  BoilerBrush.com.cyhttps://www.fda.gov/media/136312/download Fact Sheet for Healthcare Providers: https://pope.com/https://www.fda.gov/media/136313/download This test is not yet approved or cleared by the [place] FDA and has been authorized for detection and/or diagnosis of SARS-CoV-2 by FDA under an Emergency Use Authorization (EUA).  This EUA will remain in effect (meaning this test can be used) for the duration of the COVID-19 declaration under Section 564(b)(1) of the Act, 21 U.S.C. section 360bbb-3(b)(1), unless the authorization is terminated or revoked  sooner. Performed at [facility], [address], [place], [place] [address]   Urine culture     Status: None   Collection Time: 02/26/19  6:16 PM   Specimen: Urine, Random  Result Value Ref Range Status   Specimen Description   Final    URINE, RANDOM Performed at [place] [address], [place], [place] [address]    Special Requests   Final    NONE Performed at [facility], [address] [place], [place] [address]    Culture   Final    NO GROWTH Performed at [facility], [address], [place], [place] [address]    Report Status 02/28/2019 FINAL  Final  SARS CORONAVIRUS 2 (TAT 6-24 HRS) Nasopharyngeal Nasopharyngeal Swab     Status: None   Collection Time: 02/26/19 10:19 PM   Specimen: Nasopharyngeal Swab  Result Value Ref Range Status   SARS Coronavirus 2 NEGATIVE NEGATIVE Final    Comment: (NOTE) SARS-CoV-2 target nucleic acids are NOT DETECTED. The SARS-CoV-2 RNA is generally detectable in upper and lower respiratory specimens during the acute phase of infection. Negative results do not preclude SARS-CoV-2 infection, do not rule out co-infections with other pathogens, and  should not be used as the sole basis for treatment or other patient management decisions. Negative results must be combined with clinical observations, patient history, and epidemiological information. The expected result is Negative. Fact Sheet for Patients: SugarRoll.be Fact Sheet for Healthcare Providers: https://www.woods-mathews.com/ This test is not yet approved or cleared by the [place] FDA and  has been authorized for detection and/or diagnosis of SARS-CoV-2 by FDA under an Emergency Use Authorization (EUA). This EUA will remain  in effect (meaning this test can be used) for the duration of the COVID-19 declaration under Section 56 4(b)(1) of the Act, 21 U.S.C. section 360bbb-3(b)(1), unless the authorization is terminated or revoked sooner. Performed at [facility], [facility] [address], [place], [place] [address]   Culture, blood (routine x 2)     Status: None (Preliminary result)   Collection Time: 02/27/19  5:56 AM   Specimen: BLOOD  Result Value Ref Range Status   Specimen Description   Final    BLOOD RIGHT ANTECUBITAL Performed at [facility] [address] [place], [place] [address]    Special Requests   Final    BOTTLES DRAWN AEROBIC AND ANAEROBIC Blood Culture adequate volume Performed at [facility] [address] [place], [place] [address]    Culture   Final    NO GROWTH 2 DAYS Performed at [facility] [address] [place], [place] [address]    Report Status PENDING  Incomplete  Culture, blood (routine x 2)     Status: None (Preliminary result)   Collection Time: 02/27/19  5:56 AM   Specimen: BLOOD  Result Value Ref Range Status   Specimen Description   Final    BLOOD BLOOD RIGHT HAND Performed at [facility] [address] [place], [place] [address]    Special Requests   Final    BOTTLES DRAWN AEROBIC ONLY Blood Culture adequate  volume Performed at [facility] [address], [place], [place] [address]    Culture   Final    NO GROWTH 2 DAYS Performed at [facility] [address] [place], [place] [address]    Report Status PENDING  Incomplete  Time coordinating discharge: Over 30 minutes  SIGNED:  Arnetha Courser, MD  [facility] 03/01/2019, 1:18 PM Pager [phone]  If 7PM-7AM, please contact night-coverage [email or web address] Password [email or web address]  This record has been created using Conservation officer, historic buildings. Errors have been sought and corrected,but may not always be located. Such creation errors do not reflect on the standard of care.

## 2019-03-04 LAB — CULTURE, BLOOD (ROUTINE X 2)
Culture: NO GROWTH
Culture: NO GROWTH
Special Requests: ADEQUATE
Special Requests: ADEQUATE

## 2019-03-29 ENCOUNTER — Ambulatory Visit: Payer: 59 | Admitting: Internal Medicine

## 2020-12-17 IMAGING — CT CT ABD-PELV W/ CM
2 of 4 series · 16 of 46 positions shown, 18 images · IV contrast (omnipaque)
Comparison: None.

CLINICAL DATA: Acute onset abdominal pain and nausea and vomiting
today.

EXAM:
CT ABDOMEN AND PELVIS WITH CONTRAST
TECHNIQUE: Multidetector CT imaging of the abdomen and pelvis was performed
using the standard protocol following bolus administration of
intravenous contrast.
CONTRAST:  100mL OMNIPAQUE IOHEXOL 300 MG/ML  SOLN

[Series 2: axial st · axial · 0.74mm/px · z∈[+818,+1243]mm · 13 of 97 slices shown, 15 images]
[im 6/97  soft-tissue]
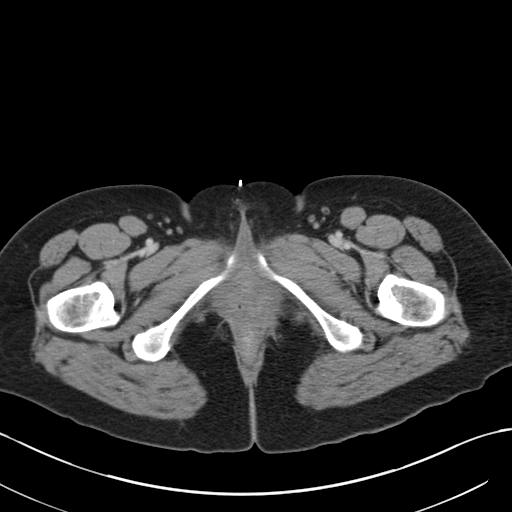
[im 6/97  bone]
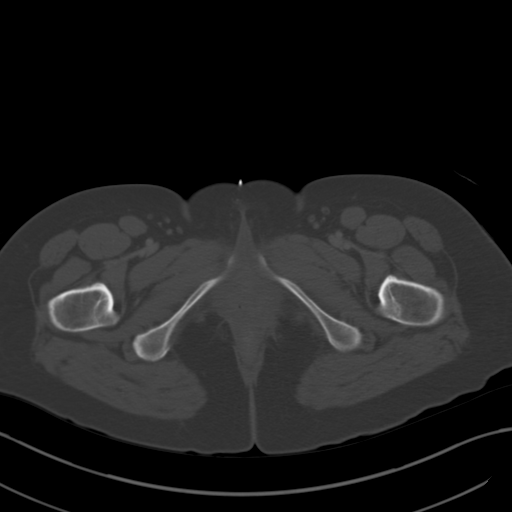
[im 16/97  soft-tissue]
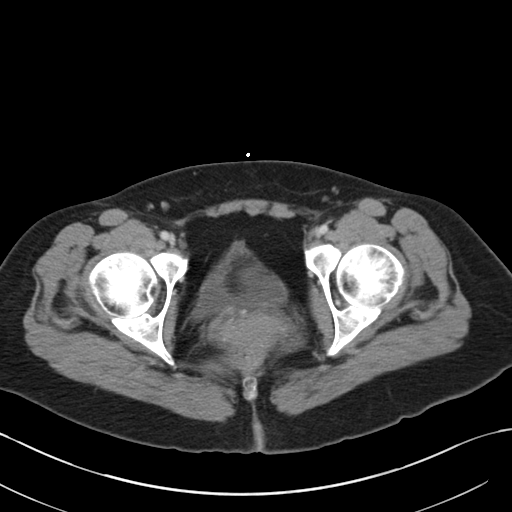
[im 21/97  soft-tissue]
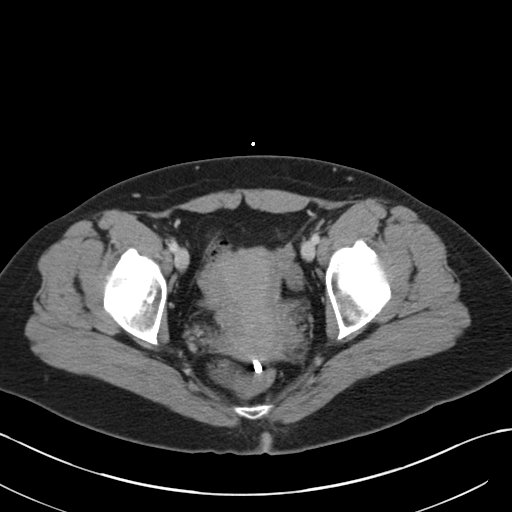
[im 26/97  soft-tissue]
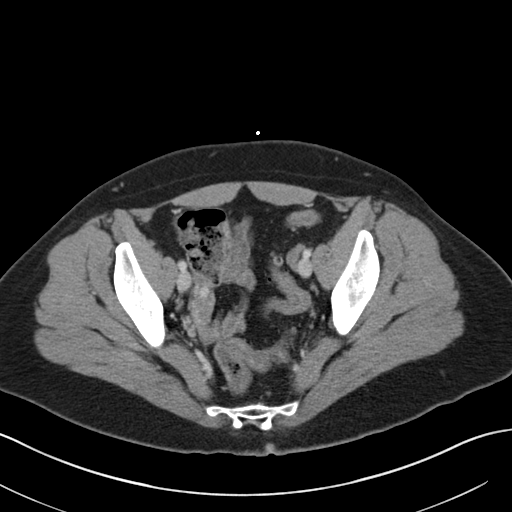
[im 36/97  soft-tissue]
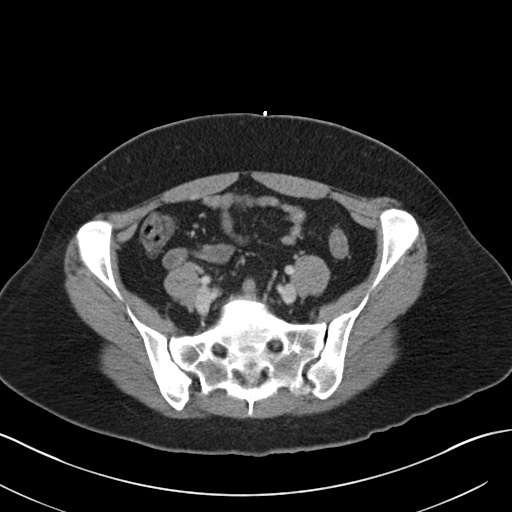
[im 41/97  soft-tissue]
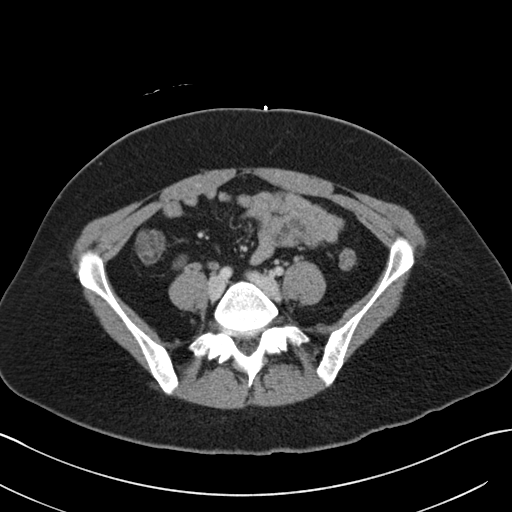
[im 51/97  soft-tissue]
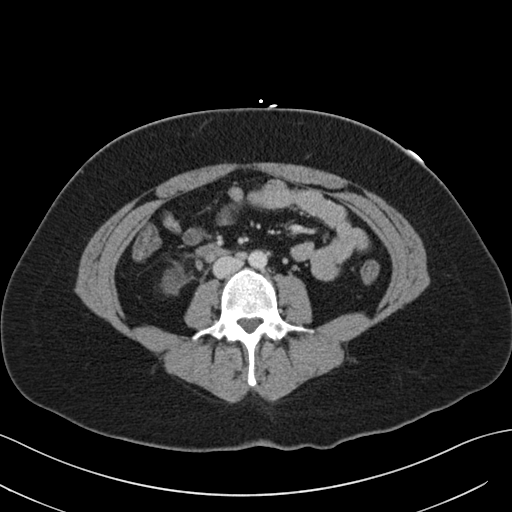
[im 56/97  soft-tissue]
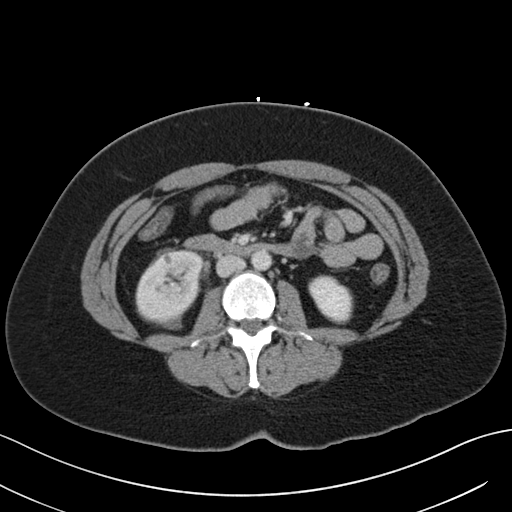
[im 61/97  soft-tissue]
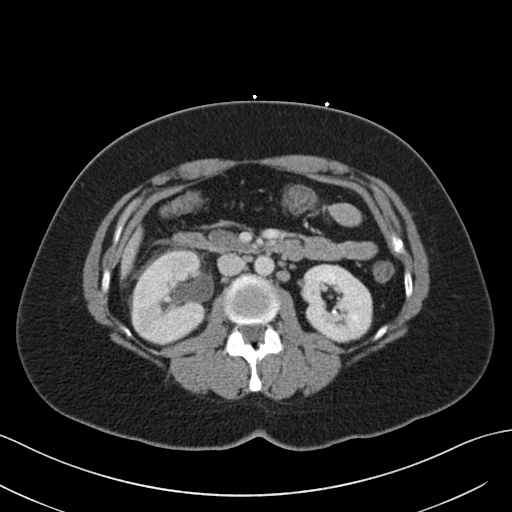
[im 61/97  bone]
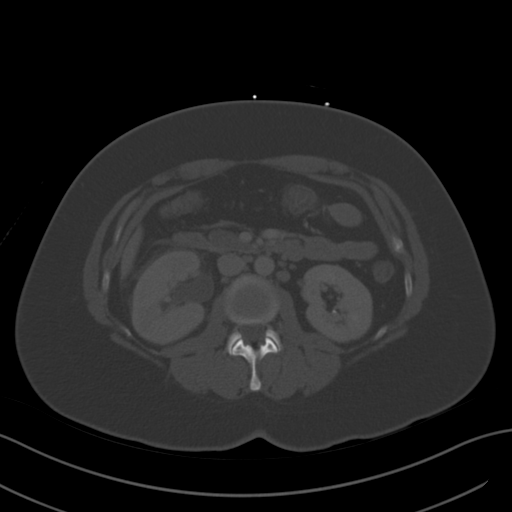
[im 71/97  soft-tissue]
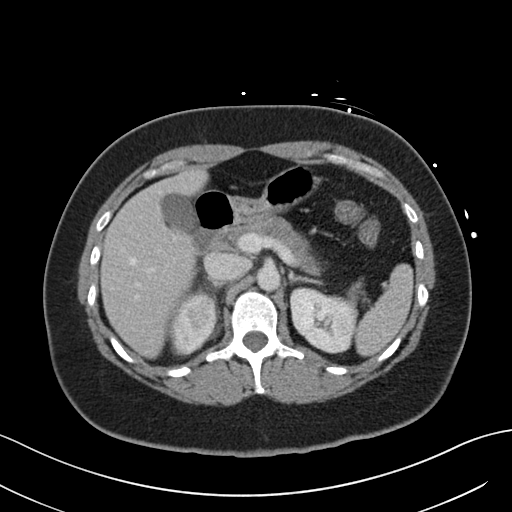
[im 76/97  soft-tissue]
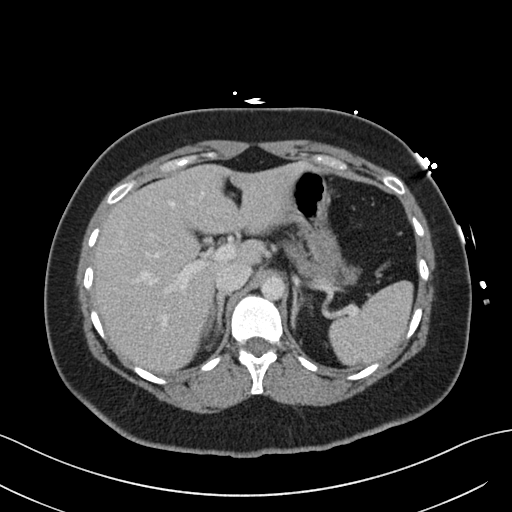
[im 81/97  soft-tissue]
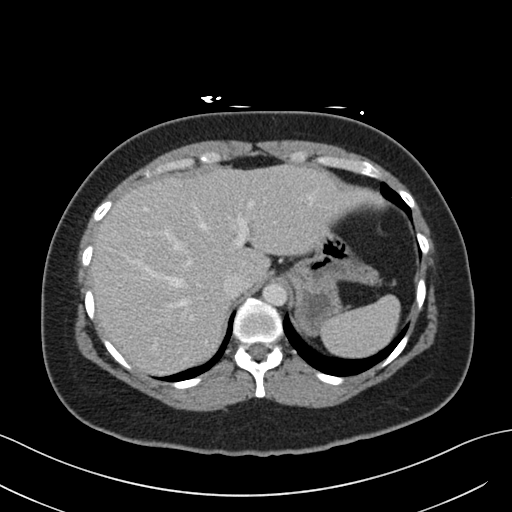
[im 91/97  soft-tissue]
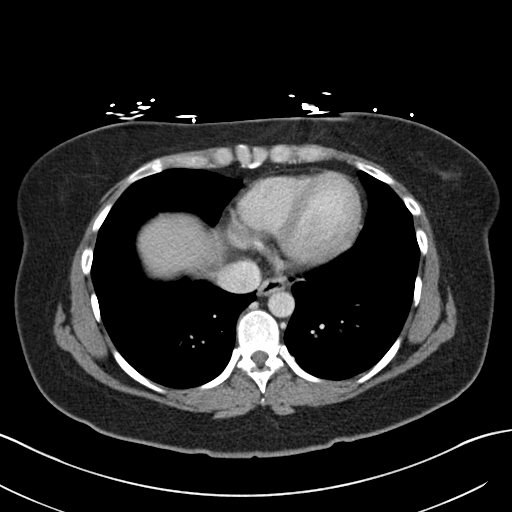

[Series 5: coronal st · coronal · 0.70mm/px · 3 of 134 slices shown]
[im 45/134  soft-tissue]
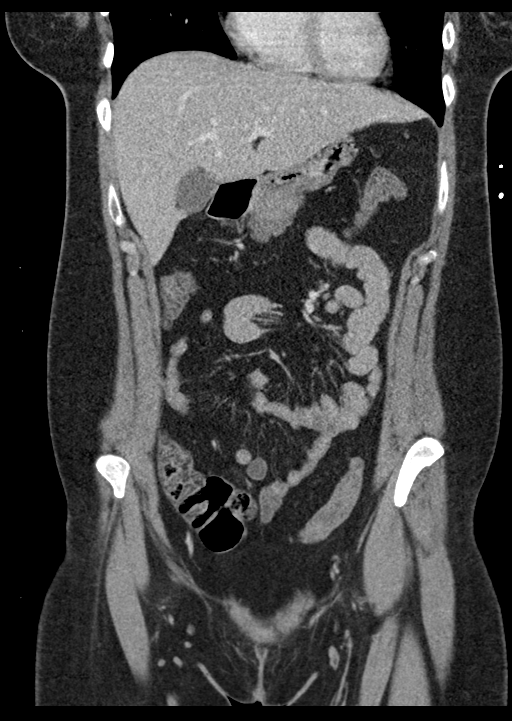
[im 60/134  soft-tissue]
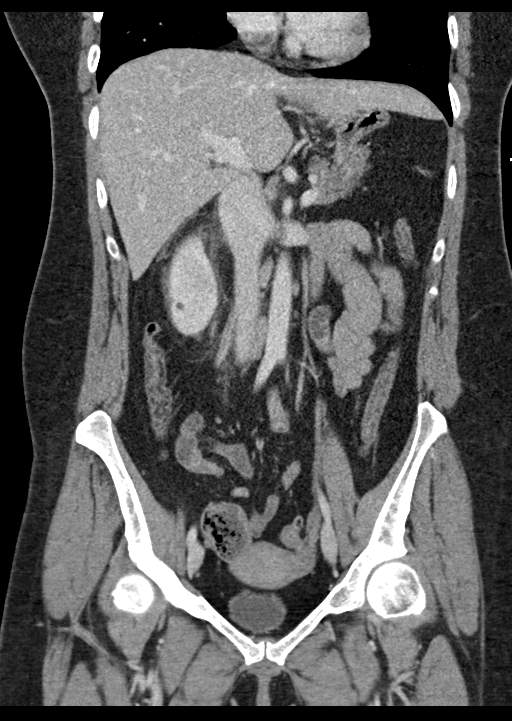
[im 74/134  soft-tissue]
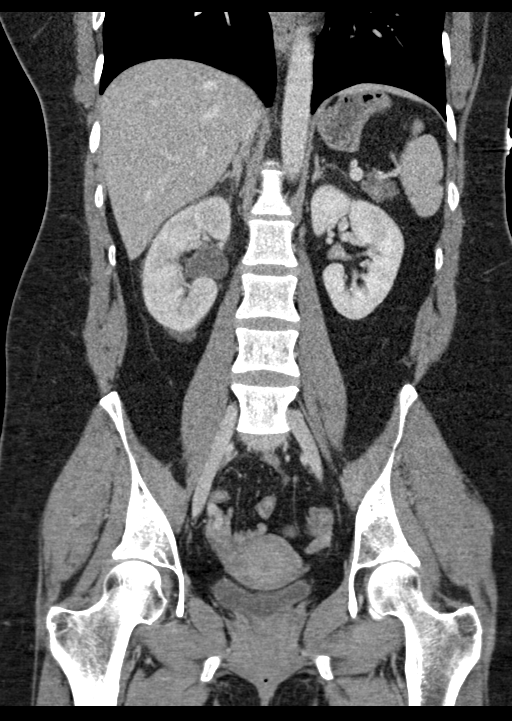

[16 of 46 positions shown; findings below may reference images not displayed]

FINDINGS: Lower Chest: No acute findings.

Hepatobiliary: No hepatic masses identified. Gallbladder is
unremarkable. No evidence of biliary ductal dilatation.

Pancreas:  No mass or inflammatory changes.

Spleen: Within normal limits in size and appearance.

Adrenals/Urinary Tract: Small less than 5 mm renal calculi are seen
in both kidneys. Mild right hydroureteronephrosis and perinephric
stranding is seen. No ureteral calculi are identified, however a 3
mm calculus is seen in the urinary bladder, consistent with a
recently passed calculus. A sub-cm right renal cyst is noted,
however there is no evidence of renal mass or abscess.

Stomach/Bowel: No evidence of obstruction, inflammatory process or
abnormal fluid collections. Diverticulosis is seen mainly involving
the descending and sigmoid colon, however there is no evidence of
diverticulitis.

Vascular/Lymphatic: No pathologically enlarged lymph nodes. No
abdominal aortic aneurysm.

Reproductive: No mass or other significant abnormality. Clips from
previous bilateral tubal ligation noted.

Other:  None.

Musculoskeletal:  No suspicious bone lesions identified.
IMPRESSION: Mild right hydroureteronephrosis and perinephric stranding. 3 mm
calculus in urinary bladder, consistent with recently passed
ureteral calculus.

Bilateral nephrolithiasis.

Colonic diverticulosis, without radiographic evidence of
diverticulitis.
